# Patient Record
Sex: Female | Born: 1970 | Hispanic: Yes | Smoking: Never smoker
Health system: Southern US, Community
[De-identification: ages and names within clinical notes are randomized; demographics above are authoritative.]

## PROBLEM LIST (undated history)

## (undated) DIAGNOSIS — K2970 Gastritis, unspecified, without bleeding: Secondary | ICD-10-CM

## (undated) DIAGNOSIS — K219 Gastro-esophageal reflux disease without esophagitis: Secondary | ICD-10-CM

## (undated) DIAGNOSIS — K8020 Calculus of gallbladder without cholecystitis without obstruction: Secondary | ICD-10-CM

## (undated) DIAGNOSIS — D649 Anemia, unspecified: Secondary | ICD-10-CM

## (undated) DIAGNOSIS — J45909 Unspecified asthma, uncomplicated: Secondary | ICD-10-CM

## (undated) DIAGNOSIS — J302 Other seasonal allergic rhinitis: Secondary | ICD-10-CM

## (undated) HISTORY — DX: Anemia, unspecified: D64.9

## (undated) HISTORY — PX: WISDOM TOOTH EXTRACTION: SHX21

## (undated) HISTORY — DX: Gastro-esophageal reflux disease without esophagitis: K21.9

## (undated) HISTORY — DX: Other seasonal allergic rhinitis: J30.2

---

## 2013-05-07 ENCOUNTER — Emergency Department (HOSPITAL_COMMUNITY)
Admission: EM | Admit: 2013-05-07 | Discharge: 2013-05-08 | Disposition: A | Discharge status: Home / Self Care | Payer: Self-pay | Attending: Emergency Medicine | Admitting: Emergency Medicine

## 2013-05-07 ENCOUNTER — Emergency Department (HOSPITAL_COMMUNITY): Payer: Self-pay

## 2013-05-07 ENCOUNTER — Encounter (HOSPITAL_COMMUNITY): Payer: Self-pay | Admitting: Emergency Medicine

## 2013-05-07 DIAGNOSIS — Z79899 Other long term (current) drug therapy: Secondary | ICD-10-CM | POA: Insufficient documentation

## 2013-05-07 DIAGNOSIS — M79609 Pain in unspecified limb: Secondary | ICD-10-CM | POA: Insufficient documentation

## 2013-05-07 DIAGNOSIS — M7989 Other specified soft tissue disorders: Secondary | ICD-10-CM | POA: Insufficient documentation

## 2013-05-07 DIAGNOSIS — R110 Nausea: Secondary | ICD-10-CM | POA: Insufficient documentation

## 2013-05-07 DIAGNOSIS — R079 Chest pain, unspecified: Secondary | ICD-10-CM

## 2013-05-07 DIAGNOSIS — J45901 Unspecified asthma with (acute) exacerbation: Secondary | ICD-10-CM | POA: Insufficient documentation

## 2013-05-07 DIAGNOSIS — M549 Dorsalgia, unspecified: Secondary | ICD-10-CM | POA: Insufficient documentation

## 2013-05-07 DIAGNOSIS — R0789 Other chest pain: Secondary | ICD-10-CM | POA: Insufficient documentation

## 2013-05-07 HISTORY — DX: Unspecified asthma, uncomplicated: J45.909

## 2013-05-07 NOTE — ED Notes (Signed)
Pt c/o mid sternal chest pain and low back pain. Pt also states she has swelling in her L arm and hand. Symptoms x 5 days. Pt states she has had pressure to her chest and felt like fainting yesterday. Pt states she has also felt nauseous. Pt alert, no acute distress. Skin warm and dry.

## 2013-05-07 NOTE — ED Notes (Signed)
PA at bedside.

## 2013-05-07 NOTE — ED Notes (Signed)
EKG given to EDP,Knapp,Iva,MD. For review. 

## 2013-05-08 LAB — CBC WITH DIFFERENTIAL/PLATELET
Basophils Absolute: 0 10*3/uL (ref 0.0–0.1)
Basophils Relative: 0 % (ref 0–1)
EOS PCT: 1 % (ref 0–5)
Eosinophils Absolute: 0.2 10*3/uL (ref 0.0–0.7)
HCT: 37.8 % (ref 36.0–46.0)
HEMOGLOBIN: 12.6 g/dL (ref 12.0–15.0)
LYMPHS PCT: 31 % (ref 12–46)
Lymphs Abs: 3.5 10*3/uL (ref 0.7–4.0)
MCH: 28.8 pg (ref 26.0–34.0)
MCHC: 33.3 g/dL (ref 30.0–36.0)
MCV: 86.5 fL (ref 78.0–100.0)
MONOS PCT: 8 % (ref 3–12)
Monocytes Absolute: 0.8 10*3/uL (ref 0.1–1.0)
NEUTROS ABS: 6.6 10*3/uL (ref 1.7–7.7)
Neutrophils Relative %: 60 % (ref 43–77)
Platelets: 239 10*3/uL (ref 150–400)
RBC: 4.37 MIL/uL (ref 3.87–5.11)
RDW: 13.3 % (ref 11.5–15.5)
WBC: 11 10*3/uL — AB (ref 4.0–10.5)

## 2013-05-08 LAB — COMPREHENSIVE METABOLIC PANEL
ALT: 21 U/L (ref 0–35)
AST: 27 U/L (ref 0–37)
Albumin: 3.7 g/dL (ref 3.5–5.2)
Alkaline Phosphatase: 102 U/L (ref 39–117)
BUN: 12 mg/dL (ref 6–23)
CHLORIDE: 100 meq/L (ref 96–112)
CO2: 20 mEq/L (ref 19–32)
Calcium: 9.5 mg/dL (ref 8.4–10.5)
Creatinine, Ser: 0.58 mg/dL (ref 0.50–1.10)
GFR calc non Af Amer: >90 mL/min (ref >90)
Glucose, Bld: 92 mg/dL (ref 70–99)
Potassium: 4.1 mEq/L (ref 3.7–5.3)
Sodium: 138 mEq/L (ref 137–147)
Total Protein: 7.7 g/dL (ref 6.0–8.3)

## 2013-05-08 LAB — TROPONIN I: Troponin I: <0.3 ng/mL (ref <0.30)

## 2013-05-08 MED ORDER — OMEPRAZOLE 20 MG PO CPDR: 20.0000 mg | DELAYED_RELEASE_CAPSULE | Freq: Every day | ORAL | Status: DC

## 2013-05-08 NOTE — ED Provider Notes (Signed)
Medical screening examination/treatment/procedure(s) were performed by non-physician practitioner and as supervising physician I was immediately available for consultation/collaboration.   EKG Interpretation None       Derwood KaplanAnkit Raushanah Osmundson, MD 05/08/13 [phone]

## 2013-05-08 NOTE — ED Provider Notes (Signed)
CSN: [id number]     Arrival date & time 05/07/13  2041 History   First MD Initiated Contact with Patient 05/07/13 2213     Chief Complaint  Patient presents with  . Chest Pain  . Back Pain     (Consider location/radiation/quality/duration/timing/severity/associated sxs/prior Treatment) Patient is a 43 y.o. female presenting with chest pain. The history is provided by the patient and a relative. A language interpreter was used Veterinary surgeon used).  Chest Pain Pain location:  L chest Pain quality: aching   Pain radiates to the back: yes   Pain severity:  Moderate Onset quality:  Gradual Duration:  5 days Associated symptoms: nausea and shortness of breath   Associated symptoms: no abdominal pain, no cough, no fever and not vomiting   Associated symptoms comment:  She has had left sided chest pain for the past 5 days. She reports that it comes and goes, better with rest, worse with activity but also worse when she gets into an argument. She reports the pain goes into her left arm and leg and causes swelling. She has had nausea without vomiting.    Past Medical History  Diagnosis Date  . Asthma    History reviewed. No pertinent past surgical history. No family history on file. History  Substance Use Topics  . Smoking status: Not on file  . Smokeless tobacco: Not on file  . Alcohol Use: Not on file   OB History   Grav Para Term Preterm Abortions TAB SAB Ect Mult Living                 Review of Systems  Constitutional: Negative for fever and chills.  Respiratory: Positive for shortness of breath. Negative for cough.   Cardiovascular: Positive for chest pain.  Gastrointestinal: Positive for nausea. Negative for vomiting and abdominal pain.  Musculoskeletal: Negative.   Skin: Negative.   Neurological: Negative.       Allergies  Ibuprofen and Penicillins  Home Medications   Current Outpatient Rx  Name  Route  Sig  Dispense  Refill  . acetaminophen (TYLENOL)  500 MG tablet   Oral   Take 1,000 mg by mouth every 6 (six) hours as needed for moderate pain.         Marland Kitchen omeprazole (PRILOSEC) 20 MG capsule   Oral   Take 20 mg by mouth daily.         Marland Kitchen omeprazole (PRILOSEC) 20 MG capsule   Oral   Take 1 capsule (20 mg total) by mouth daily.   14 capsule   0    BP 123/79  Pulse 86  Temp(Src) 97.6 F (36.4 C) (Oral)  Resp 23  SpO2 100% Physical Exam  Constitutional: She is oriented to person, place, and time. She appears well-developed and well-nourished.  HENT:  Head: Normocephalic.  Neck: Normal range of motion. Neck supple.  Cardiovascular: Normal rate and regular rhythm.   Pulmonary/Chest: Effort normal and breath sounds normal.  Abdominal: Soft. Bowel sounds are normal. There is no tenderness. There is no rebound and no guarding.  Musculoskeletal: Normal range of motion.  Neurological: She is alert and oriented to person, place, and time.  Skin: Skin is warm and dry. No rash noted.  Psychiatric: She has a normal mood and affect.    ED Course  Procedures (including critical care time) Labs Review Labs Reviewed  CBC WITH DIFFERENTIAL - Abnormal; Notable for the following:    WBC 11.0 (*)    All other  components within normal limits  COMPREHENSIVE METABOLIC PANEL - Abnormal; Notable for the following:    Total Bilirubin <0.2 (*)    All other components within normal limits  TROPONIN I   Results for orders placed during the hospital encounter of 05/07/13  TROPONIN I      Result Value Ref Range   Troponin I <0.30  <0.30 ng/mL  CBC WITH DIFFERENTIAL      Result Value Ref Range   WBC 11.0 (*) 4.0 - 10.5 K/uL   RBC 4.37  3.87 - 5.11 MIL/uL   Hemoglobin 12.6  12.0 - 15.0 g/dL   HCT 19.137.8  47.836.0 - 29.546.0 %   MCV 86.5  78.0 - 100.0 fL   MCH 28.8  26.0 - 34.0 pg   MCHC 33.3  30.0 - 36.0 g/dL   RDW 62.113.3  30.811.5 - 65.715.5 %   Platelets 239  150 - 400 K/uL   Neutrophils Relative % 60  43 - 77 %   Neutro Abs 6.6  1.7 - 7.7 K/uL    Lymphocytes Relative 31  12 - 46 %   Lymphs Abs 3.5  0.7 - 4.0 K/uL   Monocytes Relative 8  3 - 12 %   Monocytes Absolute 0.8  0.1 - 1.0 K/uL   Eosinophils Relative 1  0 - 5 %   Eosinophils Absolute 0.2  0.0 - 0.7 K/uL   Basophils Relative 0  0 - 1 %   Basophils Absolute 0.0  0.0 - 0.1 K/uL  COMPREHENSIVE METABOLIC PANEL      Result Value Ref Range   Sodium 138  137 - 147 mEq/L   Potassium 4.1  3.7 - 5.3 mEq/L   Chloride 100  96 - 112 mEq/L   CO2 20  19 - 32 mEq/L   Glucose, Bld 92  70 - 99 mg/dL   BUN 12  6 - 23 mg/dL   Creatinine, Ser 8.460.58  0.50 - 1.10 mg/dL   Calcium 9.5  8.4 - 96.210.5 mg/dL   Total Protein 7.7  6.0 - 8.3 g/dL   Albumin 3.7  3.5 - 5.2 g/dL   AST 27  0 - 37 U/L   ALT 21  0 - 35 U/L   Alkaline Phosphatase 102  39 - 117 U/L   Total Bilirubin <0.2 (*) 0.3 - 1.2 mg/dL   GFR calc non Af Amer >90  >90 mL/min   GFR calc Af Amer >90  >90 mL/min   Dg Chest Portable 1 View  05/08/2013   CLINICAL DATA:  Chest pain  EXAM: PORTABLE CHEST - 1 VIEW  COMPARISON:  None.  FINDINGS: The cardiac and mediastinal silhouettes are within normal limits.  The lungs are normally inflated. No airspace consolidation, pleural effusion, or pulmonary edema is identified. There is no pneumothorax.  No acute osseous abnormality identified.  IMPRESSION: No acute cardiopulmonary abnormality.   Electronically Signed   By: Rise MuBenjamin  McClintock M.D.   On: 05/08/2013 01:04   Imaging Review Dg Chest Portable 1 View  05/08/2013   CLINICAL DATA:  Chest pain  EXAM: PORTABLE CHEST - 1 VIEW  COMPARISON:  None.  FINDINGS: The cardiac and mediastinal silhouettes are within normal limits.  The lungs are normally inflated. No airspace consolidation, pleural effusion, or pulmonary edema is identified. There is no pneumothorax.  No acute osseous abnormality identified.  IMPRESSION: No acute cardiopulmonary abnormality.   Electronically Signed   By: Rise MuBenjamin  McClintock M.D.   On: 05/08/2013 01:04  EKG  Interpretation None      MDM   Final diagnoses:  Chest pain    She is well appearing, normal VS with atypical chest pain for ACS. Normal labs, x-ray and EKG reassuring. Daughter gives additional history of frequent hiccups - ?reflux causing symptoms. Will give trial of Prilosec and encourage PCP follow up.    Arnoldo Hooker, PA-C 05/08/13 0147

## 2013-05-08 NOTE — Discharge Instructions (Signed)
Dolor en el pecho (inespecfico)  (Chest Pain, Nonspecific)  Frecuentemente es difcil dar un diagnstico especfico de la causa de un dolor en el pecho. Siempre existe una posibilidad de que el dolor est relacionado con algo ms grave como un ataque cardaco o un cogulo en los pulmones. Necesitar realizar un seguimiento con el mdico para una mayor evaluacin.   CAUSAS   Acidez.   Neumona o bronquitis.   Ansiedad o estrs.   Una inflamacin de la zona que rodea al corazn (pericarditis) o a los pulmones (pleuritis, pleuresa).   Un cogulo sanguneo en el pulmn.   Pulmones colapsados (neumotrax). Puede aparecer de manera repentina por s solo (neumotrax espontneo) o por un traumatismo en el pecho.   Culebrilla (virus del herpes zster).  Las paredes del pecho estn compuestas de huesos, msculos y cartlagos. Cualquiera de estos puede ser fuente del dolor.   Puede haber una contusin en los huesos debido a una lesin.   Puede haber un esguince en los msculos o el cartlago ocasionado por la tos o un esfuerzo.   El cartlago tambin puede verse afectado por una inflamacin y producir dolor (costocondritis).  DIAGNSTICO  Puede ser necesario realizar anlisis de laboratorio u otros estudios tales como radiografas, electrocardiograma, prueba de esfuerzo, o diagnstico por imgenes para el corazn para determinar la causa de su dolor.   TRATAMIENTO   El tratamiento depender de la causa que provoque el dolor en el pecho. El tratamiento pueden incluir:   Bloqueadores de cido para la acidez estomacal.   Medicamentos antiinflamatorios.   Analgsicos para las enfermedades inflamatorias.   Antibiticos si existe infeccin.   Podrn aconsejarle que modifique su estilo de vida. Esto incluye dejar de fumar y evitar el alcohol, la cafena y el chocolate.   Se le aconsejar que duerma con la cabeza levantada (elevada). Esto reduce la probabilidad de que el cido vuelva hacia atrs desde el estmago  hasta el esfago.   La mayora de las veces, el dolor en el pecho no especfico mejora dentro de los 2 a 3 das con reposo y medicamentos para el dolor suaves.  INSTRUCCIONES PARA EL CUIDADO DOMICILIARIO   Si le prescriben antibiticos, tmelos tal como se le indic. Tmelos todos, aunque se sienta mejor.   Durante los siguientes das, evite la actividad fsica que le hace aparecer el dolor. Contine con las actividades fsicas tal como se le indic.   No fume.   Evite consumir alcohol.   Slo tome medicamentos de venta libre o prescriptos para calmar el dolor, las molestias o bajar la fiebre segn las indicaciones de su mdico.   Siga las indicaciones del profesional que lo asiste para un mayor control si los problemas persisten.   Cumpla con todas las visitas de control programadas. Si no lo hace, podr desarrollar problemas permanentes (crnicos) relacionados con el dolor. Si tiene algn problema para asistir a la cita, debe comunicarse con el establecimiento para obtener asistencia.  SOLICITE ATENCIN MDICA SI:   Tiene problemas que considere que pueden ser efectos secundarios de los medicamentos que toma. Lea con cuidado las recomendaciones para su medicacin.   El dolor en el pecho contina incluso despus de haber seguido el tratamiento.   Observa una erupcin en el pecho, que presenta ampollas.  SOLICITE ATENCIN MDICA DE INMEDIATO SI:   El dolor en el pecho aumenta o se extiende al brazo, cuello, mandbula, espalda o abdomen.   Le falta el aire, aumenta la tos o tose y   escupe sangre.   Tiene dolor intenso en la espalda o el abdomen, nuseas o vmitos.   Presenta debilidad, desmayos o escalofros.   Tiene fiebre.  ESTO ES UNA EMERGENCIA. No espere a que el dolor se vaya. Pida ayuda mdica de inmediato. Comunquese con el servicio de urgencias de su localidad (911 en [place]). No maneje solo hasta el hospital.  EST SEGURO QUE:    Comprende las instrucciones para el alta  mdica.   Controlar su enfermedad.   Solicitar atencin mdica de inmediato segn las indicaciones.  Document Released: 02/12/2005 Document Revised: 05/07/2011  [employer] Patient Information 2014 [employer].

## 2014-09-14 ENCOUNTER — Emergency Department (HOSPITAL_COMMUNITY)
Admission: EM | Admit: 2014-09-14 | Discharge: 2014-09-14 | Disposition: A | Discharge status: Home / Self Care | Payer: Self-pay | Attending: Emergency Medicine | Admitting: Emergency Medicine

## 2014-09-14 ENCOUNTER — Encounter (HOSPITAL_COMMUNITY): Payer: Self-pay | Admitting: *Deleted

## 2014-09-14 DIAGNOSIS — Z79899 Other long term (current) drug therapy: Secondary | ICD-10-CM | POA: Insufficient documentation

## 2014-09-14 DIAGNOSIS — L980 Pyogenic granuloma: Secondary | ICD-10-CM | POA: Insufficient documentation

## 2014-09-14 DIAGNOSIS — J45909 Unspecified asthma, uncomplicated: Secondary | ICD-10-CM | POA: Insufficient documentation

## 2014-09-14 DIAGNOSIS — Z880 Allergy status to penicillin: Secondary | ICD-10-CM | POA: Insufficient documentation

## 2014-09-14 NOTE — ED Provider Notes (Signed)
CSN: [id number]     Arrival date & time 09/14/14  2103 History  This chart was scribed for non-physician practitioner, Antony MaduraKelly Aalyah Mansouri, PA-C working with Elwin MochaBlair Walden, MD by Placido SouLogan Joldersma, ED scribe. This patient was seen in room WTR9/WTR9 and the patient's care was started at 9:36 PM.   Chief Complaint  Patient presents with  . Finger Injury   The history is provided by the patient and a relative. No language interpreter was used.    HPI Comments: Christina Vance is a 44 y.o. female who presents to the Emergency Department complaining of a constant, mild, point of swelling to her right thumb with onset 1 month ago. 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 She notes intermittent but controlled bleeding from the wound and constant, mild, pain. She denies taking any medications for her symptoms. She denies fever or chills.    Past Medical History  Diagnosis Date  . Asthma    History reviewed. No pertinent past surgical history. No family history on file. History  Substance Use Topics  . Smoking status: Never Smoker   . Smokeless tobacco: Not on file  . Alcohol Use: No   OB History    No data available      Review of Systems  Skin: Positive for wound.  All other systems reviewed and are negative.   Allergies  Ibuprofen and Penicillins  Home Medications   Prior to Admission medications   Medication Sig Start Date End Date Taking? Authorizing Provider  acetaminophen (TYLENOL) 500 MG tablet Take 1,000 mg by mouth every 6 (six) hours as needed for moderate pain.    Historical Provider, MD  omeprazole (PRILOSEC) 20 MG capsule Take 20 mg by mouth daily.    Historical Provider, MD  omeprazole (PRILOSEC) 20 MG capsule Take 1 capsule (20 mg total) by mouth daily. 05/08/13   Shari Upstill, PA-C   BP 110/80 mmHg  Pulse 91  Temp(Src) 98.2 F (36.8 C) (Oral)  Resp 18  SpO2 97%  LMP 09/07/2014   Physical Exam   Constitutional: She is oriented to person, place, and time. She appears well-developed and well-nourished. No distress.  HENT:  Head: Normocephalic and atraumatic.  Eyes: Conjunctivae and EOM are normal. No scleral icterus.  Neck: Normal range of motion.  Cardiovascular: Normal rate, regular rhythm and intact distal pulses.   Distal radial pulse 2+ in the right upper extremity. Capillary refill brisk in all digits.  Pulmonary/Chest: Effort normal. No respiratory distress.  Musculoskeletal: Normal range of motion.  Neurological: She is alert and oriented to person, place, and time. She exhibits normal muscle tone. Coordination normal.  Sensation to light touch intact in all digits. Patient able to wiggle all fingers.  Skin: Skin is warm and dry. No rash noted. She is not diaphoretic. No erythema. No pallor.  Small pyogenic granuloma noted to the right thumb. Scan bleeding is well controlled with pressure.  Psychiatric: She has a normal mood and affect. Her behavior is normal.  Nursing note and vitals reviewed.   ED Course  Procedures  DIAGNOSTIC STUDIES: Oxygen Saturation is 97% on RA, normal by my interpretation.    COORDINATION OF CARE: 9:43 PM Discussed treatment plan with pt at bedside and pt agreed to plan.  Labs Review Labs Reviewed - No data to display  Imaging Review No results found.   EKG Interpretation None      MDM   Final diagnoses:  Pyogenic granuloma of  skin    44 year old female presents to the emergency department for evaluation of a bump on her right thumb. Physical exam is consistent with a pyogenic granuloma. Patient is neurovascularly intact. Will refer to dermatology for further evaluation of symptoms. Return precautions given. Patient discharged in good condition.  I personally performed the services described in this documentation, which was scribed in my presence. The recorded information has been reviewed and is accurate.   Filed Vitals:    09/14/14 2127  BP: 110/80  Pulse: 91  Temp: 98.2 F (36.8 C)  TempSrc: Oral  Resp: 18  SpO2: 97%     Antony Madura, PA-C 09/14/14 2208  Elwin Mocha, MD 09/14/14 [phone]

## 2014-09-14 NOTE — ED Notes (Signed)
Pt states she has had a bump on her finger that has been bleeding intermittently for the past month. 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

## 2014-09-14 NOTE — Discharge Instructions (Signed)
You have a pyogenic granuloma on your finger. A pyogenic granuloma is a relatively common skin growth. It is usually a small red, oozing and bleeding bump that looks like raw hamburger meat. It often seems to follows a minor injury and grows rapidly over a period of a few weeks to an average size of a half an inch. The head, neck, upper trunk and hands and feet are the most commonly sites. Follow up with a dermatologist to discuss having this removed from your finger. Keep the area covered to prevent further bleeding, which can occur when it is hit on something. Take tylenol or ibuprofen for pain.  Usted tiene un granuloma pigeno en el dedo . Un granuloma pigeno es un crecimiento relativamente comn de la piel . Por lo general, una pequea de color rojo , secrecin y sangrado protuberancia que se ve como carne de [place] . A menudo parece que despus de una lesin menor y crece rpidamente durante un perodo de unas pocas semanas a un tamao promedio de International aid/development workermedia pulgada . La cabeza, el cuello, la parte superior del tronco y las manos y los pies son los sitios ms comunes . Haga un seguimiento con un dermatlogo para discutir tener esta eliminado de su dedo. Mantenga el rea cubierta para evitar el sangrado , que puede ocurrir cuando se ha [place]ado en algo. Tomar Tylenol o ibuprofeno para el dolor .

## 2015-05-14 ENCOUNTER — Emergency Department (HOSPITAL_COMMUNITY)
Admission: EM | Admit: 2015-05-14 | Discharge: 2015-05-15 | Disposition: A | Discharge status: Home / Self Care | Payer: Self-pay | Attending: Emergency Medicine | Admitting: Emergency Medicine

## 2015-05-14 DIAGNOSIS — Y9289 Other specified places as the place of occurrence of the external cause: Secondary | ICD-10-CM | POA: Insufficient documentation

## 2015-05-14 DIAGNOSIS — Z79899 Other long term (current) drug therapy: Secondary | ICD-10-CM | POA: Insufficient documentation

## 2015-05-14 DIAGNOSIS — T7840XA Allergy, unspecified, initial encounter: Secondary | ICD-10-CM | POA: Insufficient documentation

## 2015-05-14 DIAGNOSIS — Z880 Allergy status to penicillin: Secondary | ICD-10-CM | POA: Insufficient documentation

## 2015-05-14 DIAGNOSIS — R220 Localized swelling, mass and lump, head: Secondary | ICD-10-CM | POA: Insufficient documentation

## 2015-05-14 DIAGNOSIS — J45909 Unspecified asthma, uncomplicated: Secondary | ICD-10-CM | POA: Insufficient documentation

## 2015-05-14 DIAGNOSIS — Y9389 Activity, other specified: Secondary | ICD-10-CM | POA: Insufficient documentation

## 2015-05-14 DIAGNOSIS — Y998 Other external cause status: Secondary | ICD-10-CM | POA: Insufficient documentation

## 2015-05-14 DIAGNOSIS — X58XXXA Exposure to other specified factors, initial encounter: Secondary | ICD-10-CM | POA: Insufficient documentation

## 2015-05-15 ENCOUNTER — Encounter (HOSPITAL_COMMUNITY): Payer: Self-pay | Admitting: Emergency Medicine

## 2015-05-15 MED ORDER — SODIUM CHLORIDE 0.9 % IV BOLUS (SEPSIS)
1000.0000 mL | Freq: Once | INTRAVENOUS | Status: AC
  Administered 2015-05-15: 1000 mL via INTRAVENOUS

## 2015-05-15 MED ORDER — DIPHENHYDRAMINE HCL 25 MG PO TABS: 25.0000 mg | ORAL_TABLET | Freq: Four times a day (QID) | ORAL | Status: DC | PRN

## 2015-05-15 MED ORDER — PREDNISONE 20 MG PO TABS: 40.0000 mg | ORAL_TABLET | Freq: Every day | ORAL | Status: DC

## 2015-05-15 MED ORDER — FAMOTIDINE IN NACL 20-0.9 MG/50ML-% IV SOLN
20.0000 mg | Freq: Once | INTRAVENOUS | Status: AC
  Administered 2015-05-15: 20 mg via INTRAVENOUS
  Filled 2015-05-15: qty 50

## 2015-05-15 MED ORDER — METHYLPREDNISOLONE SODIUM SUCC 125 MG IJ SOLR
125.0000 mg | Freq: Once | INTRAMUSCULAR | Status: AC
  Administered 2015-05-15: 125 mg via INTRAVENOUS
  Filled 2015-05-15: qty 2

## 2015-05-15 MED ORDER — DIPHENHYDRAMINE HCL 50 MG/ML IJ SOLN
25.0000 mg | Freq: Once | INTRAMUSCULAR | Status: AC
  Administered 2015-05-15: 25 mg via INTRAVENOUS
  Filled 2015-05-15: qty 1

## 2015-05-15 NOTE — ED Provider Notes (Signed)
CSN: [id number]     Arrival date & time 05/14/15  2339 History   First MD Initiated Contact with Patient 05/15/15 0015     Chief Complaint  Patient presents with  . Angioedema     (Consider location/radiation/quality/duration/timing/severity/associated sxs/prior Treatment) HPI Comments: Patient with past medical history of asthma presents emergency department with chief complaint allergic reaction. She states that she was eating pork tamales this evening around 6:30 PM. She states that sitting thereafter, she noticed some tingling and itching sensation in her lips. Her lips then began to swell. She also reports breaking out in rash on her arms. She states that her throat feels scratchy. She denies difficulty breathing. Denies ever having any problems like this before. She states that she has had some nausea, but denies any vomiting or diarrhea.  The history is provided by the patient. No language interpreter was used.    Past Medical History  Diagnosis Date  . Asthma    History reviewed. No pertinent past surgical history. No family history on file. Social History  Substance Use Topics  . Smoking status: Never Smoker   . Smokeless tobacco: None  . Alcohol Use: No   OB History    No data available     Review of Systems  Constitutional: Negative for fever and chills.  Respiratory: Negative for shortness of breath.   Cardiovascular: Negative for chest pain.  Gastrointestinal: Negative for nausea, vomiting, diarrhea and constipation.  Genitourinary: Negative for dysuria.  All other systems reviewed and are negative.     Allergies  Ibuprofen and Penicillins  Home Medications   Prior to Admission medications   Medication Sig Start Date End Date Taking? Authorizing Provider  acetaminophen (TYLENOL) 500 MG tablet Take 1,000 mg by mouth every 6 (six) hours as needed for moderate pain.    Historical Provider, MD  omeprazole (PRILOSEC) 20 MG capsule Take 20 mg by mouth daily.     Historical Provider, MD  omeprazole (PRILOSEC) 20 MG capsule Take 1 capsule (20 mg total) by mouth daily. 05/08/13   Shari Upstill, PA-C   BP 122/89 mmHg  Pulse 98  Temp(Src) 98.5 F (36.9 C) (Oral)  Resp 16  SpO2 100% Physical Exam  Constitutional: She is oriented to person, place, and time. She appears well-developed and well-nourished.  HENT:  Head: Normocephalic and atraumatic.  Lip swelling Oropharynx is clear No stridor Normal phonation  Eyes: Conjunctivae and EOM are normal. Pupils are equal, round, and reactive to light.  Neck: Normal range of motion. Neck supple.  Cardiovascular: Normal rate and regular rhythm.  Exam reveals no gallop and no friction rub.   No murmur heard. Pulmonary/Chest: Effort normal and breath sounds normal. No respiratory distress. She has no wheezes. She has no rales. She exhibits no tenderness.  CTAB No wheezing  Abdominal: Soft. Bowel sounds are normal. She exhibits no distension and no mass. There is no tenderness. There is no rebound and no guarding.  Musculoskeletal: Normal range of motion. She exhibits no edema or tenderness.  Neurological: She is alert and oriented to person, place, and time.  Skin: Skin is warm and dry.  Mild maculopapular rash on extremities  Psychiatric: She has a normal mood and affect. Her behavior is normal. Judgment and thought content normal.  Nursing note and vitals reviewed.   ED Course  Procedures (including critical care time)   MDM   Final diagnoses:  Allergic reaction, initial encounter    Patient with allergic reaction vs angioedema.  I lean more toward allergic reaction based on symptoms starting after eating pork tamales.  Patient doesn't take and lisinopril or any other BP meds.  No stridor.  Airway is intact.  No wheezing.  Will treat with benadryl, solumedrol, and pepcid.  Patient signed out to [facility], NP, who will reassess and dispo if improved.  Patient discussed with Dr. Judd LieneLo, who agrees  with the plan.    Roxy Horsemanobert Kenzi Bardwell, PA-C 05/15/15 [id number]  Geoffery Lyonsouglas Delo, MD 05/16/15 [phone]

## 2015-05-15 NOTE — ED Provider Notes (Signed)
Patient states that she feels the swelling has decreased.  She is having no difficulty swallowing.  She is on swelling, no difficulty breathing.  She has been given a prescription for prednisone and Benadryl provided by Roxy Horsemanobert Browning PA, who initially saw the patient. She has been given strict return parameters such as difficulty swallowing, difficulty breathing, abdominal pain, rapid heart rate  Christina FavorGail Stephan Nelis, NP 05/15/15 0216  Geoffery Lyonsouglas Delo, MD 05/16/15 [phone]

## 2015-05-15 NOTE — ED Notes (Signed)
Pt states she ate pork tamales around 1830 on 3/18. Pt states around 1900 her lips began to itch. She has itching in her throat, but has a clear airway. Pt is alert and oriented, but only speaks spanish but her daughter is here and can translate for her

## 2015-05-15 NOTE — Discharge Instructions (Signed)

## 2015-10-12 ENCOUNTER — Encounter (HOSPITAL_COMMUNITY): Payer: Self-pay | Admitting: Emergency Medicine

## 2015-10-12 ENCOUNTER — Emergency Department (HOSPITAL_COMMUNITY): Payer: Self-pay

## 2015-10-12 ENCOUNTER — Emergency Department (HOSPITAL_COMMUNITY)
Admission: EM | Admit: 2015-10-12 | Discharge: 2015-10-12 | Disposition: A | Discharge status: Home / Self Care | Payer: Self-pay | Attending: Emergency Medicine | Admitting: Emergency Medicine

## 2015-10-12 DIAGNOSIS — Z7952 Long term (current) use of systemic steroids: Secondary | ICD-10-CM | POA: Insufficient documentation

## 2015-10-12 DIAGNOSIS — N644 Mastodynia: Secondary | ICD-10-CM | POA: Insufficient documentation

## 2015-10-12 DIAGNOSIS — J45909 Unspecified asthma, uncomplicated: Secondary | ICD-10-CM | POA: Insufficient documentation

## 2015-10-12 DIAGNOSIS — R102 Pelvic and perineal pain: Secondary | ICD-10-CM | POA: Insufficient documentation

## 2015-10-12 DIAGNOSIS — Z79899 Other long term (current) drug therapy: Secondary | ICD-10-CM | POA: Insufficient documentation

## 2015-10-12 LAB — COMPREHENSIVE METABOLIC PANEL
ALT: 20 U/L (ref 14–54)
AST: 22 U/L (ref 15–41)
Albumin: 4.1 g/dL (ref 3.5–5.0)
Alkaline Phosphatase: 91 U/L (ref 38–126)
Anion gap: 8 (ref 5–15)
BILIRUBIN TOTAL: 0.4 mg/dL (ref 0.3–1.2)
BUN: 8 mg/dL (ref 6–20)
CHLORIDE: 107 mmol/L (ref 101–111)
CO2: 23 mmol/L (ref 22–32)
Calcium: 9 mg/dL (ref 8.9–10.3)
Creatinine, Ser: 0.55 mg/dL (ref 0.44–1.00)
GFR calc Af Amer: >60 mL/min (ref >60)
Glucose, Bld: 83 mg/dL (ref 65–99)
Potassium: 3.5 mmol/L (ref 3.5–5.1)
Sodium: 138 mmol/L (ref 135–145)
TOTAL PROTEIN: 7.4 g/dL (ref 6.5–8.1)

## 2015-10-12 LAB — CBC
HCT: 37.8 % (ref 36.0–46.0)
Hemoglobin: 12.3 g/dL (ref 12.0–15.0)
MCH: 27.8 pg (ref 26.0–34.0)
MCHC: 32.5 g/dL (ref 30.0–36.0)
MCV: 85.5 fL (ref 78.0–100.0)
PLATELETS: 299 10*3/uL (ref 150–400)
RBC: 4.42 MIL/uL (ref 3.87–5.11)
RDW: 14.4 % (ref 11.5–15.5)
WBC: 9.3 10*3/uL (ref 4.0–10.5)

## 2015-10-12 LAB — URINALYSIS, ROUTINE W REFLEX MICROSCOPIC
BILIRUBIN URINE: NEGATIVE
GLUCOSE, UA: NEGATIVE mg/dL
KETONES UR: NEGATIVE mg/dL
LEUKOCYTES UA: NEGATIVE
Nitrite: NEGATIVE
PROTEIN: NEGATIVE mg/dL
Specific Gravity, Urine: 1.01 (ref 1.005–1.030)
pH: 6 (ref 5.0–8.0)

## 2015-10-12 LAB — WET PREP, GENITAL
Sperm: NONE SEEN
TRICH WET PREP: NONE SEEN
Yeast Wet Prep HPF POC: NONE SEEN

## 2015-10-12 LAB — POC URINE PREG, ED: Preg Test, Ur: NEGATIVE

## 2015-10-12 LAB — URINE MICROSCOPIC-ADD ON

## 2015-10-12 LAB — LIPASE, BLOOD: Lipase: 31 U/L (ref 11–51)

## 2015-10-12 MED ORDER — TRAMADOL HCL 50 MG PO TABS: 50.0000 mg | ORAL_TABLET | Freq: Four times a day (QID) | ORAL | 0 refills | Status: DC | PRN

## 2015-10-12 NOTE — Discharge Instructions (Signed)
Take tramadol for pain. [facility] and [facility]

## 2015-10-12 NOTE — ED Notes (Addendum)
PA at bedside.

## 2015-10-12 NOTE — ED Triage Notes (Signed)
Pt does not speak english, translator phone used. Pt c/o abdominal pain and nipple inflammation and soreness x 1 month. Pt sts that is typical for her period but her period came and went weeks ago and she still has the symptoms. Pt sts she has a "new" discharge every now and then. Pt also c/o nausea, no vomiting. Pt sts her R ovary is bothering her and causing pain. A&Ox4 and ambulatory. Pt's son speaks english but pt does not want to be

## 2015-10-12 NOTE — ED Notes (Signed)
US at bedside

## 2015-10-12 NOTE — ED Provider Notes (Signed)
[facility]-EMERGENCY DEPT Provider Note   CSN: [id number] Arrival date & time: 10/12/15  1611     History   Chief Complaint Chief Complaint  Patient presents with  . Abdominal Pain  . Breast Problem    HPI Christina Vance is a 45 y.o. female.  HPI Christina Vance is a 45 y.o. female presents to emergency department complaining of lower abdominal pain, pain to her feet, breast pain for 2 months. Patient states that she has been to her primary care doctor who did some "blood work to check hormones" and was told everything was normal. Pt states that her doctor thought maybe it is early menaupause and gave her some medications that are not helping. Pt states her entire stomach is burning, but states pain is mainly suprapubic and radiates around to the back. She denies any nausea or vomiting. Denies any fever. No vaginal discharge. Reports regular menstrual cycle every month. Patient states that she took a pregnancy test at home and they were negative. She denies any urinary symptoms. She's not taking any for pain.  Patient is Spanish-speaking only, interpreter phone was used for this visit.  Past Medical History:  Diagnosis Date  . Asthma     There are no active problems to display for this patient.   History reviewed. No pertinent surgical history.  OB History    No data available       Home Medications    Prior to Admission medications   Medication Sig Start Date End Date Taking? Authorizing Provider  diphenhydrAMINE (BENADRYL) 25 MG tablet Take 1 tablet (25 mg total) by mouth every 6 (six) hours as needed for itching (Rash). 05/15/15   Roxy Horsemanobert Browning, PA-C  loratadine (CLARITIN) 10 MG tablet Take 10 mg by mouth daily.    Historical Provider, MD  naproxen sodium (ANAPROX) 550 MG tablet Take 550 mg by mouth 2 (two) times daily with a meal.    Historical Provider, MD  omeprazole (PRILOSEC) 20 MG capsule Take 1 capsule (20 mg total) by mouth daily. Patient not  taking: Reported on 05/15/2015 05/08/13   Elpidio AnisShari Upstill, PA-C  predniSONE (DELTASONE) 20 MG tablet Take 2 tablets (40 mg total) by mouth daily. 05/15/15   Roxy Horsemanobert Browning, PA-C    Family History No family history on file.  Social History Social History  Substance Use Topics  . Smoking status: Never Smoker  . Smokeless tobacco: Not on file  . Alcohol use No     Allergies   Ibuprofen and Penicillins   Review of Systems Review of Systems  Constitutional: Negative for chills and fever.  Respiratory: Negative for cough, chest tightness and shortness of breath.   Cardiovascular: Negative for chest pain, palpitations and leg swelling.  Gastrointestinal: Positive for abdominal pain. Negative for diarrhea, nausea and vomiting.  Genitourinary: Positive for pelvic pain. Negative for dysuria, flank pain, vaginal bleeding, vaginal discharge and vaginal pain.  Musculoskeletal: Positive for back pain. Negative for arthralgias, myalgias, neck pain and neck stiffness.  Skin: Negative for rash.  Neurological: Negative for dizziness, weakness and headaches.  All other systems reviewed and are negative.    Physical Exam Updated Vital Signs BP 124/86 (BP Location: Right Arm)   Pulse 84   Temp 98.1 F (36.7 C) (Oral)   Resp 16   SpO2 100%   Physical Exam  Constitutional: She appears well-developed and well-nourished. No distress.  HENT:  Head: Normocephalic.  Eyes: Conjunctivae are normal.  Neck: Neck supple.  Cardiovascular: Normal rate,  regular rhythm and normal heart sounds.   Pulmonary/Chest: Effort normal and breath sounds normal. No respiratory distress. She has no wheezes. She has no rales.  Normal breasts bilaterally with some tenderness over the nipple. No erythema, swelling, masses palpated, nipple discharge.   Abdominal: Soft. Bowel sounds are normal. She exhibits no distension. There is tenderness. There is no rebound.  Suprapubic tenderness. No CVA tenderness  Genitourinary:   Genitourinary Comments: Normal external genitalia. Normal vaginal canal. Small thin white discharge. Cervix is normal, closed. No CMT. No uterine or adnexal tenderness. No masses palpated.    Musculoskeletal: She exhibits no edema.  Neurological: She is alert.  Skin: Skin is warm and dry.  Psychiatric: She has a normal mood and affect. Her behavior is normal.  Nursing note and vitals reviewed.    ED Treatments / Results  Labs (all labs ordered are listed, but only abnormal results are displayed) Labs Reviewed  WET PREP, GENITAL - Abnormal; Notable for the following:       Result Value   Clue Cells Wet Prep HPF POC PRESENT (*)    WBC, Wet Prep HPF POC MODERATE (*)    All other components within normal limits  URINALYSIS, ROUTINE W REFLEX MICROSCOPIC (NOT AT [facility]) - Abnormal; Notable for the following:    APPearance CLOUDY (*)    Hgb urine dipstick SMALL (*)    All other components within normal limits  URINE MICROSCOPIC-ADD ON - Abnormal; Notable for the following:    Squamous Epithelial / LPF 6-30 (*)    Bacteria, UA FEW (*)    All other components within normal limits  LIPASE, BLOOD  COMPREHENSIVE METABOLIC PANEL  CBC  POC URINE PREG, ED  GC/CHLAMYDIA PROBE AMP ([facility]) NOT AT [facility]    EKG  EKG Interpretation None       Radiology [place] Transvaginal Non-ob  Result Date: 10/12/2015 CLINICAL DATA:  45 year old female with pain and nausea for 1 month. Negative pregnancy test today. Initial encounter. EXAM: TRANSABDOMINAL AND TRANSVAGINAL ULTRASOUND OF PELVIS TECHNIQUE: Both transabdominal and transvaginal ultrasound examinations of the pelvis were performed. Transabdominal technique was performed for global imaging of the pelvis including uterus, ovaries, adnexal regions, and pelvic cul-de-sac. It was necessary to proceed with endovaginal exam following the transabdominal exam to visualize the overuse. COMPARISON:  None FINDINGS: Uterus Measurements: 10.5 x 5.6 x 6.1 cm.  No fibroids or other mass visualized. Incidental nabothian cysts. Endometrium Thickness: 12 mm.  No focal abnormality visualized. Right ovary Measurements: 3.3 x 2.0 x 2.2 cm. 11 mm dominant cyst or follicle incidentally noted. Normal appearance/no adnexal mass. Left ovary Measurements: Could not be identified despite trans abdominal and transvaginal imaging attempts. Other findings No abnormal free fluid. IMPRESSION: 1. Normal right ovary and uterus. 2. The left ovary could not be identified despite trans abdominal and transvaginal imaging. Electronically Signed   By: Odessa FlemingH  Hall M.D.   On: 10/12/2015 19:47   [place] Pelvis Complete  Result Date: 10/12/2015 CLINICAL DATA:  45 year old female with pain and nausea for 1 month. Negative pregnancy test today. Initial encounter. EXAM: TRANSABDOMINAL AND TRANSVAGINAL ULTRASOUND OF PELVIS TECHNIQUE: Both transabdominal and transvaginal ultrasound examinations of the pelvis were performed. Transabdominal technique was performed for global imaging of the pelvis including uterus, ovaries, adnexal regions, and pelvic cul-de-sac. It was necessary to proceed with endovaginal exam following the transabdominal exam to visualize the overuse. COMPARISON:  None FINDINGS: Uterus Measurements: 10.5 x 5.6 x 6.1 cm. No fibroids or other mass visualized.  Incidental nabothian cysts. Endometrium Thickness: 12 mm.  No focal abnormality visualized. Right ovary Measurements: 3.3 x 2.0 x 2.2 cm. 11 mm dominant cyst or follicle incidentally noted. Normal appearance/no adnexal mass. Left ovary Measurements: Could not be identified despite trans abdominal and transvaginal imaging attempts. Other findings No abnormal free fluid. IMPRESSION: 1. Normal right ovary and uterus. 2. The left ovary could not be identified despite trans abdominal and transvaginal imaging. Electronically Signed   By: Odessa Fleming M.D.   On: 10/12/2015 19:47    Procedures Procedures (including critical care time)  Medications  Ordered in ED Medications - No data to display   Initial Impression / Assessment and Plan / ED Course  I have reviewed the triage vital signs and the nursing notes.  Pertinent labs & imaging results that were available during my care of the patient were reviewed by me and considered in my medical decision making (see chart for details).  Clinical Course   Pt in ED with breast tenderness bilaterally, suprapubic pain. Will check labs, do pelvic exam, [place].   9:28 PM Pelvic exam unremarkable. Labs normal. Ultrasound negative, left ovary not visualized, however no tenderness over that area. Patient is not pregnant. Breast exam is unremarkable, no concern for infection or masses at this time. No changes in bowels. No vomiting. No fever. Will give tramadol for pain. Patient is allergic to NSAIDs. I will have patient follow-up with OB/GYN for further evaluation.  Final Clinical Impressions(s) / ED Diagnoses   Final diagnoses:  Pelvic pain in female  Breast tenderness in female    New Prescriptions New Prescriptions   TRAMADOL (ULTRAM) 50 MG TABLET    Take 1 tablet (50 mg total) by mouth every 6 (six) hours as needed.     Jaynie Crumble, PA-C 10/12/15 2130    Doug Sou, MD 10/13/15 0140

## 2015-10-13 LAB — GC/CHLAMYDIA PROBE AMP (~~LOC~~) NOT AT ARMC
Chlamydia: NEGATIVE
Neisseria Gonorrhea: NEGATIVE

## 2017-08-27 ENCOUNTER — Emergency Department (HOSPITAL_COMMUNITY)
Admission: EM | Admit: 2017-08-27 | Discharge: 2017-08-27 | Disposition: A | Discharge status: Home / Self Care | Payer: Self-pay | Attending: Emergency Medicine | Admitting: Emergency Medicine

## 2017-08-27 ENCOUNTER — Encounter (HOSPITAL_COMMUNITY): Payer: Self-pay

## 2017-08-27 DIAGNOSIS — K649 Unspecified hemorrhoids: Secondary | ICD-10-CM | POA: Insufficient documentation

## 2017-08-27 DIAGNOSIS — J45909 Unspecified asthma, uncomplicated: Secondary | ICD-10-CM | POA: Insufficient documentation

## 2017-08-27 LAB — POC OCCULT BLOOD, ED: Fecal Occult Bld: POSITIVE — AB

## 2017-08-27 LAB — CBC WITH DIFFERENTIAL/PLATELET
Basophils Absolute: 0 10*3/uL (ref 0.0–0.1)
Basophils Relative: 0 %
EOS PCT: 2 %
Eosinophils Absolute: 0.1 10*3/uL (ref 0.0–0.7)
HEMATOCRIT: 36.4 % (ref 36.0–46.0)
Hemoglobin: 11.6 g/dL — ABNORMAL LOW (ref 12.0–15.0)
LYMPHS ABS: 2.9 10*3/uL (ref 0.7–4.0)
LYMPHS PCT: 41 %
MCH: 26.4 pg (ref 26.0–34.0)
MCHC: 31.9 g/dL (ref 30.0–36.0)
MCV: 82.9 fL (ref 78.0–100.0)
MONO ABS: 0.5 10*3/uL (ref 0.1–1.0)
Monocytes Relative: 7 %
NEUTROS ABS: 3.6 10*3/uL (ref 1.7–7.7)
Neutrophils Relative %: 50 %
PLATELETS: 294 10*3/uL (ref 150–400)
RBC: 4.39 MIL/uL (ref 3.87–5.11)
RDW: 15.5 % (ref 11.5–15.5)
WBC: 7.2 10*3/uL (ref 4.0–10.5)

## 2017-08-27 LAB — COMPREHENSIVE METABOLIC PANEL
ALT: 22 U/L (ref 0–44)
AST: 25 U/L (ref 15–41)
Albumin: 3.8 g/dL (ref 3.5–5.0)
Alkaline Phosphatase: 106 U/L (ref 38–126)
Anion gap: 7 (ref 5–15)
BILIRUBIN TOTAL: 0.2 mg/dL — AB (ref 0.3–1.2)
BUN: 12 mg/dL (ref 6–20)
CHLORIDE: 108 mmol/L (ref 98–111)
CO2: 25 mmol/L (ref 22–32)
Calcium: 8.8 mg/dL — ABNORMAL LOW (ref 8.9–10.3)
Creatinine, Ser: 0.57 mg/dL (ref 0.44–1.00)
Glucose, Bld: 94 mg/dL (ref 70–99)
POTASSIUM: 3.8 mmol/L (ref 3.5–5.1)
Sodium: 140 mmol/L (ref 135–145)
TOTAL PROTEIN: 7.5 g/dL (ref 6.5–8.1)

## 2017-08-27 LAB — HCG, QUANTITATIVE, PREGNANCY

## 2017-08-27 NOTE — ED Provider Notes (Signed)
[facility]-EMERGENCY DEPT Provider Note   CSN: [id number] Arrival date & time: 08/27/17  1641     History   Chief Complaint No chief complaint on file.  Language interpretor was used throughout this evaluation.   HPI Christina Vance is a 47 y.o. female.  HPI   Patient is a 47 year old female with a history of gastritis who presents the emergency department today to be evaluated for rectal bleeding which began yesterday.  She states that she felt like she needed to have a bowel movement, she strained for several minutes and was unable to have a bowel movement.  She then noticed some bright red blood on the toilet paper.   States she had a normal bowel movement this morning and noted bright red blood on the toilet paper 2 more times today.  Blood has only been on the tissue and has not been in the commode. She states that it was a mild amount and was there was "a smear" on the tissue.  Denies any blood in her stools.  Denies that there was a blood in the commode.  She notes that she had a small area of swelling to her rectum today which was mildly painful.  No diarrhea or constipation.  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  Denies any nausea or vomiting.  No urinary symptoms.  No vaginal bleeding or hematuria.  No fevers.  No chest pain shortness of breath or back pain.  Past Medical History:  Diagnosis Date  . Asthma     There are no active problems to display for this patient.   History reviewed. No pertinent surgical history.   OB History   None      Home Medications    Prior to Admission medications   Medication Sig Start Date End Date Taking? Authorizing Provider  acetaminophen (TYLENOL) 500 MG tablet Take 500 mg by mouth daily as needed for headache.   Yes [provider]  ibuprofen (ADVIL,MOTRIN) 200 MG tablet Take 200 mg by mouth daily as needed (pain).   Yes [provider]  ranitidine (ZANTAC) 150 MG tablet Take 300 mg by mouth daily as needed for heartburn.   Yes [provider]  diphenhydrAMINE (BENADRYL) 25 MG tablet Take 1 tablet (25 mg total) by mouth every 6 (six) hours as needed for itching (Rash). Patient not taking: Reported on 08/27/2017 05/15/15   Roxy Horseman, PA-C  omeprazole (PRILOSEC) 20 MG capsule Take 1 capsule (20 mg total) by mouth daily. Patient not taking: Reported on 08/27/2017 05/08/13   Elpidio Anis, PA-C  predniSONE (DELTASONE) 20 MG tablet Take 2 tablets (40 mg total) by mouth daily. Patient not taking: Reported on 08/27/2017 05/15/15   Roxy Horseman, PA-C  traMADol (ULTRAM) 50 MG tablet Take 1 tablet (50 mg total) by mouth every 6 (six) hours as needed. Patient not taking: Reported on 08/27/2017 10/12/15   Jaynie Crumble, PA-C    Family History History reviewed. No pertinent family history.  Social History Social History   Tobacco Use  . Smoking status: Never Smoker  . Smokeless tobacco: Never Used  Substance Use Topics  . Alcohol use: No  . Drug use: Never     Allergies   Ibuprofen and Penicillins   Review of Systems Review of Systems  Constitutional: Negative for chills and fever.  HENT: Negative for ear pain and sore throat.   Eyes: Negative for pain and  visual disturbance.  Respiratory: Negative for shortness of breath.   Cardiovascular: Negative for chest pain.  Gastrointestinal: Positive for rectal pain. Negative for abdominal pain, blood in stool, constipation, diarrhea, nausea and vomiting.       Rectal bleeding  Genitourinary: Negative for decreased urine volume, dysuria, frequency, hematuria, pelvic pain, urgency, vaginal bleeding and vaginal discharge.  Musculoskeletal: Negative for arthralgias and back pain.  Skin: Negative for color change and rash.  Neurological: Negative for light-headedness and headaches.  All other systems reviewed and are negative.  Physical  Exam Updated Vital Signs BP 113/86   Pulse 68   Temp 98.1 F (36.7 C) (Oral)   Resp 18   Ht 5\' 2"  (1.575 m)   Wt 78.9 kg (174 lb)   LMP 08/22/2017   SpO2 100%   BMI 31.83 kg/m   Physical Exam  Constitutional: She appears well-developed and well-nourished. No distress.  HENT:  Head: Normocephalic and atraumatic.  Mouth/Throat: Oropharynx is clear and moist.  Eyes: Pupils are equal, round, and reactive to light. Conjunctivae are normal.  Neck: Neck supple.  Cardiovascular: Normal rate, regular rhythm, normal heart sounds and intact distal pulses.  No murmur heard. Pulmonary/Chest: Effort normal and breath sounds normal. No stridor. No respiratory distress. She has no wheezes. She has no rales.  Abdominal: Soft. Bowel sounds are normal. There is no tenderness. There is no guarding.  Genitourinary:  Genitourinary Comments: Rectal exam was completed with chaperone present. There was no gross blood on rectal exam, stool was light brown and soft. No obvious external hemorrhoids. Mild amount of pain during exam, and no obvious internal hemorrhoids palpated.  Musculoskeletal: She exhibits no edema.  Neurological: She is alert.  Skin: Skin is warm and dry.  Psychiatric: She has a normal mood and affect.  Nursing note and vitals reviewed.  ED Treatments / Results  Labs (all labs ordered are listed, but only abnormal results are displayed) Labs Reviewed  CBC WITH DIFFERENTIAL/PLATELET - Abnormal; Notable for the following components:      Result Value   Hemoglobin 11.6 (*)    All other components within normal limits  COMPREHENSIVE METABOLIC PANEL - Abnormal; Notable for the following components:   Calcium 8.8 (*)    Total Bilirubin 0.2 (*)    All other components within normal limits  POC OCCULT BLOOD, ED - Abnormal; Notable for the following components:   Fecal Occult Bld POSITIVE (*)    All other components within normal limits  HCG, QUANTITATIVE, PREGNANCY     EKG None  Radiology No results found.  Procedures Procedures (including critical care time)  Medications Ordered in ED Medications - No data to display   Initial Impression / Assessment and Plan / ED Course  I have reviewed the triage vital signs and the nursing notes.  Pertinent labs & imaging results that were available during my care of the patient were reviewed by me and considered in my medical decision making (see chart for details).     Final Clinical Impressions(s) / ED Diagnoses   Final diagnoses:  Hemorrhoids, unspecified hemorrhoid type   Patient with small amount of rectal bleeding that is occurred over the last 2 days.  Vital signs stable in the ED and patient in no acute distress.  Afebrile.  Rectal exam showed no gross blood on exam however her fecal occult blood testing was positive in the ED. Based on her description of sxs her bleeding seems most consistent with hemorrhoids. She denied any blood  in her stool or in the commode and reported only "smears" of blood on the toilet tissue. She also noted that she had the sxs after she strained to have a BM. She has no h/o GI bleed. She has had no episodes of bleeding in the ED and her cbc showed a hgb of 11.6. The remainder of her lab work was benign.  I discussed the results of her lab work and that her symptoms seem most consistent with hemorrhoids.  Gave her instructions to complete warm sitz bath's as well as to take MiraLAX to prevent constipation.  I gave her a referral to gastroenterology doctor as well as information to follow-up with her PCP.  I gave her strict return precautions for any continued or worsening symptoms.  She voiced an understanding of plan and reasons to return immediately to the ED.  All questions were answered.  ED Discharge Orders    None       Rayne Du 08/27/17 2339    Benjiman Core, MD 08/28/17 [phone]

## 2017-08-27 NOTE — ED Triage Notes (Signed)
Pt reports that that she has been having blood in her stools x 2 days reports that she noticed small clots. PT reports that she has been dx with Gastritis 5 years ago and was being followed up with GI doctor and presented with the same symptoms then, Pt also reports some acid reflux that has worsened in the past few months and make eating difficult

## 2017-08-27 NOTE — Discharge Instructions (Addendum)
Please follow-up with the gastroenterologist as discussed while you are in the emergency department.  Please follow-up with your primary care doctor in 1 week for reevaluation as well.  Please return to the emergency department if you have any persistent rectal bleeding, blood in your stool, blood in the commode, lightheadedness, dizziness or any other new or worsening symptoms in the meantime.

## 2017-08-27 NOTE — ED Notes (Signed)
Pt reports bright red blood when she wipes. Denies N/V. Hx of Gastritis. No external hemorrhoids visualized upon rectal exam.

## 2018-11-05 ENCOUNTER — Other Ambulatory Visit (HOSPITAL_COMMUNITY): Payer: Self-pay | Admitting: Internal Medicine

## 2018-11-05 ENCOUNTER — Other Ambulatory Visit: Payer: Self-pay | Admitting: Internal Medicine

## 2018-11-05 DIAGNOSIS — R1032 Left lower quadrant pain: Secondary | ICD-10-CM

## 2018-11-12 ENCOUNTER — Ambulatory Visit (HOSPITAL_COMMUNITY): Payer: Self-pay | Attending: Internal Medicine

## 2018-11-12 ENCOUNTER — Encounter (HOSPITAL_COMMUNITY): Payer: Self-pay

## 2019-04-20 ENCOUNTER — Emergency Department (HOSPITAL_COMMUNITY): Payer: Self-pay

## 2019-04-20 ENCOUNTER — Emergency Department (HOSPITAL_COMMUNITY)
Admission: EM | Admit: 2019-04-20 | Discharge: 2019-04-21 | Disposition: A | Discharge status: Home / Self Care | Payer: Self-pay | Attending: Emergency Medicine | Admitting: Emergency Medicine

## 2019-04-20 ENCOUNTER — Encounter (HOSPITAL_COMMUNITY): Payer: Self-pay | Admitting: Emergency Medicine

## 2019-04-20 ENCOUNTER — Other Ambulatory Visit: Payer: Self-pay

## 2019-04-20 DIAGNOSIS — J45909 Unspecified asthma, uncomplicated: Secondary | ICD-10-CM | POA: Insufficient documentation

## 2019-04-20 DIAGNOSIS — R519 Headache, unspecified: Secondary | ICD-10-CM | POA: Insufficient documentation

## 2019-04-20 DIAGNOSIS — K219 Gastro-esophageal reflux disease without esophagitis: Secondary | ICD-10-CM | POA: Insufficient documentation

## 2019-04-20 HISTORY — DX: Gastritis, unspecified, without bleeding: K29.70

## 2019-04-20 LAB — BASIC METABOLIC PANEL
Anion gap: 10 (ref 5–15)
BUN: 13 mg/dL (ref 6–20)
CO2: 22 mmol/L (ref 22–32)
Calcium: 8.7 mg/dL — ABNORMAL LOW (ref 8.9–10.3)
Chloride: 108 mmol/L (ref 98–111)
Creatinine, Ser: 0.56 mg/dL (ref 0.44–1.00)
GFR calc Af Amer: >60 mL/min (ref >60)
GFR calc non Af Amer: >60 mL/min (ref >60)
Glucose, Bld: 91 mg/dL (ref 70–99)
Potassium: 3.3 mmol/L — ABNORMAL LOW (ref 3.5–5.1)
Sodium: 140 mmol/L (ref 135–145)

## 2019-04-20 LAB — CBC WITH DIFFERENTIAL/PLATELET
Abs Immature Granulocytes: 0.04 10*3/uL (ref 0.00–0.07)
Basophils Absolute: 0 10*3/uL (ref 0.0–0.1)
Basophils Relative: 1 %
Eosinophils Absolute: 0.3 10*3/uL (ref 0.0–0.5)
Eosinophils Relative: 4 %
HCT: 35.7 % — ABNORMAL LOW (ref 36.0–46.0)
Hemoglobin: 10.9 g/dL — ABNORMAL LOW (ref 12.0–15.0)
Immature Granulocytes: 1 %
Lymphocytes Relative: 39 %
Lymphs Abs: 3 10*3/uL (ref 0.7–4.0)
MCH: 24.4 pg — ABNORMAL LOW (ref 26.0–34.0)
MCHC: 30.5 g/dL (ref 30.0–36.0)
MCV: 80 fL (ref 80.0–100.0)
Monocytes Absolute: 0.7 10*3/uL (ref 0.1–1.0)
Monocytes Relative: 9 %
Neutro Abs: 3.7 10*3/uL (ref 1.7–7.7)
Neutrophils Relative %: 46 %
Platelets: 281 10*3/uL (ref 150–400)
RBC: 4.46 MIL/uL (ref 3.87–5.11)
RDW: 17 % — ABNORMAL HIGH (ref 11.5–15.5)
WBC: 7.7 10*3/uL (ref 4.0–10.5)
nRBC: 0 % (ref 0.0–0.2)

## 2019-04-20 LAB — I-STAT BETA HCG BLOOD, ED (MC, WL, AP ONLY): I-stat hCG, quantitative: <5 m[IU]/mL (ref <5)

## 2019-04-20 MED ORDER — ONDANSETRON HCL 4 MG/2ML IJ SOLN
4.0000 mg | Freq: Once | INTRAMUSCULAR | Status: AC
  Administered 2019-04-20: 21:00:00 4 mg via INTRAVENOUS
  Filled 2019-04-20: qty 2

## 2019-04-20 MED ORDER — SODIUM CHLORIDE 0.9 % IV BOLUS
1000.0000 mL | Freq: Once | INTRAVENOUS | Status: AC
  Administered 2019-04-20: 21:00:00 1000 mL via INTRAVENOUS

## 2019-04-20 MED ORDER — KETOROLAC TROMETHAMINE 15 MG/ML IJ SOLN
15.0000 mg | Freq: Once | INTRAMUSCULAR | Status: AC
  Administered 2019-04-20: 21:00:00 15 mg via INTRAVENOUS
  Filled 2019-04-20: qty 1

## 2019-04-20 NOTE — ED Triage Notes (Signed)
Spanish interpreter Oswaldo Done # [phone] Pt c/o headache and dizzy x 15 days, reports in mornings when eats something feels like going to come back up, denies vomiting. Pain not relieved when taking tylenol, last use was today around 12-1 pm.

## 2019-04-20 NOTE — Discharge Instructions (Signed)
Reinicie su omeprazole para los sntomas del reflujo cido [address] Tylenol segn sea necesario para los dolores de [place]. Haga un seguimiento con su mdico de cabecera

## 2019-04-20 NOTE — ED Notes (Signed)
Pt transported to CT ?

## 2019-04-20 NOTE — ED Provider Notes (Signed)
[facility]-EMERGENCY DEPT Provider Note   CSN: [id number] Arrival date & time: 04/20/19  1635     History Chief Complaint  Patient presents with  . Headache    Christina Vance is a 49 y.o. female who presents with a headache.  Patient is Spanish-speaking and language interpreter was used.  She states that for the past 15 days when she wakes up she will feel like she will have a lot of acid reflux and will be nauseous.  And she will start to get a headache and feel dizzy.  Sometimes her bilateral hands and feet will will tingle.  Symptoms seem to get better throughout the day but if she eats again will get worse.  She was taking omeprazole for GERD however stopped this a couple weeks ago because she is afraid to take medicines long-term due to side effects.  She has been taking Tylenol for her symptoms and Pepto-Bismol without significant relief.  She was taking p.o. Toradol which seemed to help her symptoms but she was told to stop this medicine.  She denies fever, chills, URI symptoms, chest pain, shortness of breath, cough, abdominal pain, vomiting, diarrhea or urinary symptoms.  She states that symptoms seem to happen after experience acid reflux but even when she was on the omeprazole and her reflux was controlled she would have headaches.   HPI     Past Medical History:  Diagnosis Date  . Asthma   . Gastritis     There are no problems to display for this patient.   History reviewed. No pertinent surgical history.   OB History   No obstetric history on file.     No family history on file.  Social History   Tobacco Use  . Smoking status: Never Smoker  . Smokeless tobacco: Never Used  Substance Use Topics  . Alcohol use: No  . Drug use: Never    Home Medications Prior to Admission medications   Medication Sig Start Date End Date Taking? Authorizing Provider  acetaminophen (TYLENOL) 500 MG tablet Take 500 mg by mouth daily as needed  for headache.   Yes [provider]  diphenhydrAMINE (BENADRYL) 25 MG tablet Take 1 tablet (25 mg total) by mouth every 6 (six) hours as needed for itching (Rash). Patient not taking: Reported on 08/27/2017 05/15/15   Roxy Horseman, PA-C  omeprazole (PRILOSEC) 20 MG capsule Take 1 capsule (20 mg total) by mouth daily. Patient not taking: Reported on 08/27/2017 05/08/13   Elpidio Anis, PA-C  predniSONE (DELTASONE) 20 MG tablet Take 2 tablets (40 mg total) by mouth daily. Patient not taking: Reported on 08/27/2017 05/15/15   Roxy Horseman, PA-C  traMADol (ULTRAM) 50 MG tablet Take 1 tablet (50 mg total) by mouth every 6 (six) hours as needed. Patient not taking: Reported on 08/27/2017 10/12/15   Jaynie Crumble, PA-C    Allergies    Ibuprofen and Penicillins  Review of Systems   Review of Systems  Constitutional: Negative for fever.  Respiratory: Negative for shortness of breath.   Cardiovascular: Negative for chest pain.  Gastrointestinal: Positive for nausea. Negative for abdominal pain, diarrhea and vomiting.  Neurological: Positive for dizziness and headaches. Negative for weakness and numbness.  All other systems reviewed and are negative.   Physical Exam Updated Vital Signs BP 118/70 (BP Location: Right Arm)   Pulse 89   Temp 99.3 F (37.4 C) (Oral)   Resp 17   LMP 03/13/2019   SpO2 98%  Physical Exam Vitals and nursing note reviewed.  Constitutional:      General: She is not in acute distress.    Appearance: She is well-developed. She is not ill-appearing.     Comments: Calm, cooperative. NAD. Spanish speaking  HENT:     Head: Normocephalic and atraumatic.  Eyes:     General: No scleral icterus.       Right eye: No discharge.        Left eye: No discharge.     Conjunctiva/sclera: Conjunctivae normal.     Pupils: Pupils are equal, round, and reactive to light.  Cardiovascular:     Rate and Rhythm: Normal rate and regular rhythm.  Pulmonary:     Effort:  Pulmonary effort is normal. No respiratory distress.     Breath sounds: Normal breath sounds.  Abdominal:     General: There is no distension.     Palpations: Abdomen is soft.     Tenderness: There is no abdominal tenderness.  Musculoskeletal:     Cervical back: Normal range of motion.  Skin:    General: Skin is warm and dry.  Neurological:     Mental Status: She is alert and oriented to person, place, and time.     Comments: Lying on stretcher in NAD. GCS 15. Speaks in a clear voice. Cranial nerves II through XII grossly intact. 5/5 strength in all extremities. Sensation fully intact.  Bilateral finger-to-nose intact. Ambulatory    Psychiatric:        Behavior: Behavior normal.     ED Results / Procedures / Treatments   Labs (all labs ordered are listed, but only abnormal results are displayed) Labs Reviewed  BASIC METABOLIC PANEL - Abnormal; Notable for the following components:      Result Value   Potassium 3.3 (*)    Calcium 8.7 (*)    All other components within normal limits  CBC WITH DIFFERENTIAL/PLATELET - Abnormal; Notable for the following components:   Hemoglobin 10.9 (*)    HCT 35.7 (*)    MCH 24.4 (*)    RDW 17.0 (*)    All other components within normal limits  I-STAT BETA HCG BLOOD, ED ([facility], [facility], [facility] ONLY)    EKG None  Radiology CT Head Wo Contrast  Result Date: 04/20/2019 CLINICAL DATA:  Headache and dizziness for 15 days EXAM: CT HEAD WITHOUT CONTRAST TECHNIQUE: Contiguous axial images were obtained from the base of the skull through the vertex without intravenous contrast. COMPARISON:  None. FINDINGS: Brain: Unenhanced images of the brain demonstrate no signs of acute infarct or hemorrhage. Lateral ventricles and midline structures are unremarkable. No acute extra-axial fluid collections. No mass effect. Vascular: No hyperdense vessel or unexpected calcification. Skull: Normal. Negative for fracture or focal lesion. Sinuses/Orbits: No acute finding. Other:  None IMPRESSION: 1. No acute intracranial process. Electronically Signed   By: Randa Ngo M.D.   On: 04/20/2019 21:51    Procedures Procedures (including critical care time)  Medications Ordered in ED Medications  sodium chloride 0.9 % bolus 1,000 mL (1,000 mLs Intravenous New Bag/Given 04/20/19 2117)  ketorolac (TORADOL) 15 MG/ML injection 15 mg (15 mg Intravenous Given 04/20/19 2117)  ondansetron (ZOFRAN) injection 4 mg (4 mg Intravenous Given 04/20/19 2117)    ED Course  I have reviewed the triage vital signs and the nursing notes.  Pertinent labs & imaging results that were available during my care of the patient were reviewed by me and considered in my medical decision making (  see chart for details).  49 year old female with acid reflux symptoms, headache and dizziness.  Patient reports symptoms have been going on for 15 days since stopping omeprazole however it sounds like symptoms could be more chronic in nature.  It is difficult to tell and there is a language barrier although Spanish interpreter was used.  Vital signs are normal.  Her neurologic exam is normal.  Heart is regular rate and rhythm.  Lungs are clear to auscultation.  Abdomen is soft and nontender.  Will obtain labs, CT head, provide symptomatic control  Labs are remarkable for mild hypokalemia and mild anemia.  CT head is negative.  On reexam she feels much better.  She states that she stopped the omeprazole because she was worried about long-term effects but has had a rebound of her GERD symptoms.  We discussed that every medicine has side effects but it sounds like the benefit of taking this is outweighing the risks and she was encouraged to restart this medicine.  She is also encouraged to take Tylenol as needed for headache and to follow-up with her doctor regarding her headaches.  MDM Rules/Calculators/A&P                       Final Clinical Impression(s) / ED Diagnoses Final diagnoses:  Bad headache    Gastroesophageal reflux disease, unspecified whether esophagitis present    Rx / DC Orders ED Discharge Orders    None       Bethel Born, PA-C 04/20/19 2338    Mancel Bale, MD 04/21/19 [phone]

## 2019-05-06 ENCOUNTER — Other Ambulatory Visit: Payer: Self-pay

## 2019-05-06 ENCOUNTER — Encounter (HOSPITAL_COMMUNITY): Payer: Self-pay | Admitting: Emergency Medicine

## 2019-05-06 ENCOUNTER — Emergency Department (HOSPITAL_COMMUNITY)
Admission: EM | Admit: 2019-05-06 | Discharge: 2019-05-06 | Disposition: A | Discharge status: Home / Self Care | Payer: Self-pay | Attending: Emergency Medicine | Admitting: Emergency Medicine

## 2019-05-06 DIAGNOSIS — R42 Dizziness and giddiness: Secondary | ICD-10-CM | POA: Insufficient documentation

## 2019-05-06 DIAGNOSIS — N926 Irregular menstruation, unspecified: Secondary | ICD-10-CM | POA: Insufficient documentation

## 2019-05-06 LAB — CBC
HCT: 35.7 % — ABNORMAL LOW (ref 36.0–46.0)
Hemoglobin: 10.9 g/dL — ABNORMAL LOW (ref 12.0–15.0)
MCH: 24.6 pg — ABNORMAL LOW (ref 26.0–34.0)
MCHC: 30.5 g/dL (ref 30.0–36.0)
MCV: 80.6 fL (ref 80.0–100.0)
Platelets: 309 10*3/uL (ref 150–400)
RBC: 4.43 MIL/uL (ref 3.87–5.11)
RDW: 16.9 % — ABNORMAL HIGH (ref 11.5–15.5)
WBC: 9.1 10*3/uL (ref 4.0–10.5)
nRBC: 0 % (ref 0.0–0.2)

## 2019-05-06 LAB — I-STAT BETA HCG BLOOD, ED (MC, WL, AP ONLY): I-stat hCG, quantitative: <5 m[IU]/mL (ref <5)

## 2019-05-06 LAB — URINALYSIS, ROUTINE W REFLEX MICROSCOPIC
Bilirubin Urine: NEGATIVE
Glucose, UA: NEGATIVE mg/dL
Ketones, ur: NEGATIVE mg/dL
Leukocytes,Ua: NEGATIVE
Nitrite: NEGATIVE
Protein, ur: NEGATIVE mg/dL
Specific Gravity, Urine: 1.023 (ref 1.005–1.030)
pH: 5 (ref 5.0–8.0)

## 2019-05-06 LAB — COMPREHENSIVE METABOLIC PANEL
ALT: 30 U/L (ref 0–44)
AST: 26 U/L (ref 15–41)
Albumin: 3.8 g/dL (ref 3.5–5.0)
Alkaline Phosphatase: 121 U/L (ref 38–126)
Anion gap: 9 (ref 5–15)
BUN: 14 mg/dL (ref 6–20)
CO2: 25 mmol/L (ref 22–32)
Calcium: 8.8 mg/dL — ABNORMAL LOW (ref 8.9–10.3)
Chloride: 105 mmol/L (ref 98–111)
Creatinine, Ser: 0.56 mg/dL (ref 0.44–1.00)
GFR calc Af Amer: >60 mL/min (ref >60)
GFR calc non Af Amer: >60 mL/min (ref >60)
Glucose, Bld: 104 mg/dL — ABNORMAL HIGH (ref 70–99)
Potassium: 3.6 mmol/L (ref 3.5–5.1)
Sodium: 139 mmol/L (ref 135–145)
Total Bilirubin: 0.4 mg/dL (ref 0.3–1.2)
Total Protein: 7.4 g/dL (ref 6.5–8.1)

## 2019-05-06 LAB — LIPASE, BLOOD: Lipase: 30 U/L (ref 11–51)

## 2019-05-06 NOTE — ED Provider Notes (Signed)
[facility]-EMERGENCY DEPT Provider Note   CSN: [id number] Arrival date & time: 05/06/19  1558     History Chief Complaint  Patient presents with  . Dizziness  . Abdominal Pain    Christina Vance is a 49 y.o. female.  49 year old female with prior medical history as detailed below presents for evaluation abdominal pain.  Patient reports that her last period was in early February.  She reports intermittent abdominal cramping since.  She has done multiple pregnancy test at home that were negative.  She does report that her menses have been somewhat more irregular over the last months.  She denies current abdominal pain.  She denies associated fever, nausea, vomiting, bowel movement change, urinary change, or other specific complaint.    The history is provided by the patient and medical records. The history is limited by a language barrier. A language interpreter was used.  Abdominal Pain Pain location:  Generalized Pain quality: aching, bloating and cramping   Pain radiates to:  Does not radiate Pain severity:  No pain Onset quality:  Gradual Duration:  3 weeks Timing:  Intermittent Progression:  Resolved Chronicity:  New Relieved by:  Nothing Worsened by:  Nothing Ineffective treatments:  None tried      Past Medical History:  Diagnosis Date  . Asthma   . Gastritis     There are no problems to display for this patient.   History reviewed. No pertinent surgical history.   OB History   No obstetric history on file.     No family history on file.  Social History   Tobacco Use  . Smoking status: Never Smoker  . Smokeless tobacco: Never Used  Substance Use Topics  . Alcohol use: No  . Drug use: Never    Home Medications Prior to Admission medications   Medication Sig Start Date End Date Taking? Authorizing Provider  acetaminophen (TYLENOL) 500 MG tablet Take 500 mg by mouth daily as needed for headache.   Yes [provider]  omeprazole (PRILOSEC) 20 MG capsule Take 1 capsule (20 mg total) by mouth daily. Patient not taking: Reported on 08/27/2017 05/08/13   Elpidio Anis, PA-C  predniSONE (DELTASONE) 20 MG tablet Take 2 tablets (40 mg total) by mouth daily. Patient not taking: Reported on 08/27/2017 05/15/15   Roxy Horseman, PA-C  diphenhydrAMINE (BENADRYL) 25 MG tablet Take 1 tablet (25 mg total) by mouth every 6 (six) hours as needed for itching (Rash). Patient not taking: Reported on 08/27/2017 05/15/15 04/20/19  Roxy Horseman, PA-C    Allergies    Ibuprofen and Penicillins  Review of Systems   Review of Systems  Gastrointestinal: Positive for abdominal pain.  All other systems reviewed and are negative.   Physical Exam Updated Vital Signs BP 111/75 (BP Location: Right Arm)   Pulse 81   Temp 98.4 F (36.9 C) (Oral)   Resp 17   LMP 03/14/2019   SpO2 100%   Physical Exam Vitals and nursing note reviewed.  Constitutional:      General: She is not in acute distress.    Appearance: She is well-developed.  HENT:     Head: Normocephalic and atraumatic.  Eyes:     Conjunctiva/sclera: Conjunctivae normal.     Pupils: Pupils are equal, round, and reactive to light.  Cardiovascular:     Rate and Rhythm: Normal rate and regular rhythm.     Heart sounds: Normal heart sounds.  Pulmonary:     Effort: Pulmonary  effort is normal. No respiratory distress.     Breath sounds: Normal breath sounds.  Abdominal:     General: Abdomen is flat. There is no distension.     Palpations: Abdomen is soft.     Tenderness: There is no abdominal tenderness.  Musculoskeletal:        General: No deformity. Normal range of motion.     Cervical back: Normal range of motion and neck supple.  Skin:    General: Skin is warm and dry.  Neurological:     Mental Status: She is alert and oriented to person, place, and time.     ED Results / Procedures / Treatments   Labs (all labs ordered are listed, but  only abnormal results are displayed) Labs Reviewed  COMPREHENSIVE METABOLIC PANEL - Abnormal; Notable for the following components:      Result Value   Glucose, Bld 104 (*)    Calcium 8.8 (*)    All other components within normal limits  CBC - Abnormal; Notable for the following components:   Hemoglobin 10.9 (*)    HCT 35.7 (*)    MCH 24.6 (*)    RDW 16.9 (*)    All other components within normal limits  URINALYSIS, ROUTINE W REFLEX MICROSCOPIC - Abnormal; Notable for the following components:   Hgb urine dipstick SMALL (*)    Bacteria, UA RARE (*)    All other components within normal limits  LIPASE, BLOOD  I-STAT BETA HCG BLOOD, ED ([facility], [facility], [facility] ONLY)    EKG None  Radiology No results found.  Procedures Procedures (including critical care time)  Medications Ordered in ED Medications - No data to display  ED Course  I have reviewed the triage vital signs and the nursing notes.  Pertinent labs & imaging results that were available during my care of the patient were reviewed by me and considered in my medical decision making (see chart for details).    MDM Rules/Calculators/A&P                      MDM  Screen complete  Christina Vance was evaluated in Emergency Department on 05/06/2019 for the symptoms described in the history of present illness. She was evaluated in the context of the global COVID-19 pandemic, which necessitated consideration that the patient might be at risk for infection with the SARS-CoV-2 virus that causes COVID-19. Institutional protocols and algorithms that pertain to the evaluation of patients at risk for COVID-19 are in a state of rapid change based on information released by regulatory bodies including the CDC and federal and state organizations. These policies and algorithms were followed during the patient's care in the ED.  Patient is presenting for evaluation of reported abdominal discomfort.  This is associated with menstrual  irregularity.  Patient's work-up does not suggest significant acute pathology.  I suspect that her symptoms of likely secondary to reported menstrual regularity.  Importance of close follow-up is stressed.  Strict return cautions given and understood. Final Clinical Impression(s) / ED Diagnoses Final diagnoses:  Irregular menses    Rx / DC Orders ED Discharge Orders    None       Valarie Merino, MD 05/06/19 2031

## 2019-05-06 NOTE — ED Notes (Signed)
Pt ambulatory to BR with steady gait and able to provide urine sample. Pt back on the monitor at this time and has call bell within reach.

## 2019-05-06 NOTE — ED Triage Notes (Signed)
Spanish interpreter used, Daphine Deutscher [phone].  Got menstrual in Jan. Menstrual not come in Feb or this month and taken pregnancy test at home that were negative. Pt c/o tightness in abd and dizziness for a month. Pt has been urinating more frequently than normal. Has nausea but no vomiting. Was seen for gastritis and was given medications that helped.

## 2019-05-06 NOTE — ED Notes (Signed)
PT given discharge instructions in spanish. Pt denies any questions or concerns. PT A/Ox4 and ambulatory at discharge.

## 2019-05-06 NOTE — Discharge Instructions (Addendum)
Please return for any problem.  Follow-up with your regular care provider as instructed.  Follow up with GYN as instructed.  Use Tylenol (500-1000mg ) orally every 8 hours for pain.

## 2019-09-18 ENCOUNTER — Emergency Department (HOSPITAL_COMMUNITY)
Admission: EM | Admit: 2019-09-18 | Discharge: 2019-09-19 | Disposition: A | Discharge status: Home / Self Care | Payer: Self-pay | Attending: Emergency Medicine | Admitting: Emergency Medicine

## 2019-09-18 ENCOUNTER — Encounter (HOSPITAL_COMMUNITY): Payer: Self-pay | Admitting: Pharmacy Technician

## 2019-09-18 ENCOUNTER — Other Ambulatory Visit: Payer: Self-pay

## 2019-09-18 DIAGNOSIS — J45909 Unspecified asthma, uncomplicated: Secondary | ICD-10-CM | POA: Insufficient documentation

## 2019-09-18 DIAGNOSIS — R42 Dizziness and giddiness: Secondary | ICD-10-CM | POA: Insufficient documentation

## 2019-09-18 LAB — COMPREHENSIVE METABOLIC PANEL
ALT: 26 U/L (ref 0–44)
AST: 28 U/L (ref 15–41)
Albumin: 3.3 g/dL — ABNORMAL LOW (ref 3.5–5.0)
Alkaline Phosphatase: 113 U/L (ref 38–126)
Anion gap: 11 (ref 5–15)
BUN: 11 mg/dL (ref 6–20)
CO2: 24 mmol/L (ref 22–32)
Calcium: 8.6 mg/dL — ABNORMAL LOW (ref 8.9–10.3)
Chloride: 104 mmol/L (ref 98–111)
Creatinine, Ser: 0.78 mg/dL (ref 0.44–1.00)
GFR calc Af Amer: >60 mL/min (ref >60)
GFR calc non Af Amer: >60 mL/min (ref >60)
Glucose, Bld: 88 mg/dL (ref 70–99)
Potassium: 3.6 mmol/L (ref 3.5–5.1)
Sodium: 139 mmol/L (ref 135–145)
Total Bilirubin: 0.2 mg/dL — ABNORMAL LOW (ref 0.3–1.2)
Total Protein: 6.9 g/dL (ref 6.5–8.1)

## 2019-09-18 LAB — URINALYSIS, ROUTINE W REFLEX MICROSCOPIC
Bilirubin Urine: NEGATIVE
Glucose, UA: NEGATIVE mg/dL
Ketones, ur: NEGATIVE mg/dL
Leukocytes,Ua: NEGATIVE
Nitrite: NEGATIVE
Protein, ur: NEGATIVE mg/dL
Specific Gravity, Urine: 1.011 (ref 1.005–1.030)
pH: 6 (ref 5.0–8.0)

## 2019-09-18 LAB — CBC
HCT: 33.5 % — ABNORMAL LOW (ref 36.0–46.0)
Hemoglobin: 9.9 g/dL — ABNORMAL LOW (ref 12.0–15.0)
MCH: 23.4 pg — ABNORMAL LOW (ref 26.0–34.0)
MCHC: 29.6 g/dL — ABNORMAL LOW (ref 30.0–36.0)
MCV: 79.2 fL — ABNORMAL LOW (ref 80.0–100.0)
Platelets: 291 10*3/uL (ref 150–400)
RBC: 4.23 MIL/uL (ref 3.87–5.11)
RDW: 18 % — ABNORMAL HIGH (ref 11.5–15.5)
WBC: 8.4 10*3/uL (ref 4.0–10.5)
nRBC: 0 % (ref 0.0–0.2)

## 2019-09-18 LAB — LIPASE, BLOOD: Lipase: 35 U/L (ref 11–51)

## 2019-09-18 MED ORDER — SODIUM CHLORIDE 0.9% FLUSH: 3.0000 mL | Freq: Once | INTRAVENOUS | Status: DC

## 2019-09-18 NOTE — ED Triage Notes (Addendum)
Per interpreter, pt with dizziness/near syncope X 15 days. Pt states the feeling is constant but worsens when she eats. Pt also c/o hand and feet swelling along with "gallbladder" pain. Pt states the gallbladder pain is almost resolved due to dietary changes.

## 2019-09-19 LAB — I-STAT BETA HCG BLOOD, ED (MC, WL, AP ONLY): I-stat hCG, quantitative: <5 m[IU]/mL (ref <5)

## 2019-09-19 NOTE — ED Notes (Signed)
Patient verbalizes understanding of discharge instructions. Opportunity for questioning and answers were provided. Armband removed by staff, pt discharged from ED   Electronic interpreter [facility] ID# [phone]) used in this d/c

## 2019-09-19 NOTE — ED Provider Notes (Signed)
[facility] EMERGENCY DEPARTMENT Provider Note  CSN: [id number] Arrival date & time: 09/18/19 1743  Chief Complaint(s) Dizziness  HPI Christina Vance is a 48 y.o. female   The history is provided by the patient.  Dizziness Quality:  Head spinning and lightheadedness Severity:  Moderate Onset quality:  Gradual Duration:  15 days Timing:  Intermittent Progression:  Waxing and waning (currently nearly resolved) Chronicity:  New Context comment:  Standing for prolonged period of time Relieved by:  Being still Worsened by:  Movement and turning head Associated symptoms: no blood in stool, no chest pain, no headaches, no palpitations, no shortness of breath, no vision changes, no vomiting and no weakness     Past Medical History Past Medical History:  Diagnosis Date  . Asthma   . Gastritis    There are no problems to display for this patient.  Home Medication(s) Prior to Admission medications   Medication Sig Start Date End Date Taking? Authorizing Provider  acetaminophen (TYLENOL) 500 MG tablet Take 500 mg by mouth daily as needed for headache.   Yes [provider]  famotidine (PEPCID) 20 MG tablet Take 20 mg by mouth in the morning and at bedtime.   Yes [provider]  meclizine (ANTIVERT) 25 MG tablet Take 25 mg by mouth 3 (three) times daily as needed for dizziness.  09/14/19  Yes [provider]  omeprazole (PRILOSEC) 20 MG capsule Take 1 capsule (20 mg total) by mouth daily. Patient not taking: Reported on 08/27/2017 05/08/13   Elpidio Anis, PA-C  predniSONE (DELTASONE) 20 MG tablet Take 2 tablets (40 mg total) by mouth daily. Patient not taking: Reported on 08/27/2017 05/15/15   Roxy Horseman, PA-C  diphenhydrAMINE (BENADRYL) 25 MG tablet Take 1 tablet (25 mg total) by mouth every 6 (six) hours as needed for itching (Rash). Patient not taking: Reported on 08/27/2017 05/15/15 04/20/19  Roxy Horseman, PA-C                                                                                                                                     Past Surgical History History reviewed. No pertinent surgical history. Family History No family history on file.  Social History Social History   Tobacco Use  . Smoking status: Never Smoker  . Smokeless tobacco: Never Used  Substance Use Topics  . Alcohol use: No  . Drug use: Never   Allergies Ibuprofen and Penicillins  Review of Systems Review of Systems  Respiratory: Negative for shortness of breath.   Cardiovascular: Negative for chest pain and palpitations.  Gastrointestinal: Negative for blood in stool and vomiting.  Neurological: Positive for dizziness. Negative for weakness and headaches.   All other systems are reviewed and are negative for acute change except as noted in the HPI  Physical Exam Vital Signs  I have reviewed the triage vital signs BP 109/81   Pulse 67  Temp 98.6 F (37 C)   Resp 15   SpO2 99%   Physical Exam Vitals reviewed.  Constitutional:      General: She is not in acute distress.    Appearance: She is well-developed. She is not diaphoretic.  HENT:     Head: Normocephalic and atraumatic.     Nose: Nose normal.  Eyes:     General: No scleral icterus.       Right eye: No discharge.        Left eye: No discharge.     Conjunctiva/sclera: Conjunctivae normal.     Pupils: Pupils are equal, round, and reactive to light.  Cardiovascular:     Rate and Rhythm: Normal rate and regular rhythm.     Heart sounds: No murmur heard.  No friction rub. No gallop.   Pulmonary:     Effort: Pulmonary effort is normal. No respiratory distress.     Breath sounds: Normal breath sounds. No stridor. No rales.  Abdominal:     General: There is no distension.     Palpations: Abdomen is soft.     Tenderness: There is no abdominal tenderness.  Musculoskeletal:        General: No tenderness.     Cervical back: Normal range of motion and neck  supple.  Skin:    General: Skin is warm and dry.     Findings: No erythema or rash.  Neurological:     Mental Status: She is alert and oriented to person, place, and time.     Comments: Mental Status:  Alert and oriented to person, place, and time.  Attention and concentration normal.  Speech clear.  Recent memory is intact  Cranial Nerves:  II Visual Fields: Intact to confrontation. Visual fields intact. III, IV, VI: Pupils equal and reactive to light and near. Full eye movement without nystagmus  V Facial Sensation: Normal. No weakness of masticatory muscles  VII: No facial weakness or asymmetry  VIII Auditory Acuity: Grossly normal  IX/X: The uvula is midline; the palate elevates symmetrically  XI: Normal sternocleidomastoid and trapezius strength  XII: The tongue is midline. No atrophy or fasciculations.   Motor System: Muscle Strength: 5/5 and symmetric in the upper and lower extremities. No pronation or drift.  Muscle Tone: Tone and muscle bulk are normal in the upper and lower extremities.   Reflexes: DTRs: 1+ and symmetrical in all four extremities.  Coordination: . No tremor.  Sensation: Intact to light touch.  Gait: Routine gait normal.      ED Results and Treatments Labs (all labs ordered are listed, but only abnormal results are displayed) Labs Reviewed  CBC - Abnormal; Notable for the following components:      Result Value   Hemoglobin 9.9 (*)    HCT 33.5 (*)    MCV 79.2 (*)    MCH 23.4 (*)    MCHC 29.6 (*)    RDW 18.0 (*)    All other components within normal limits  URINALYSIS, ROUTINE W REFLEX MICROSCOPIC - Abnormal; Notable for the following components:   Hgb urine dipstick SMALL (*)    Bacteria, UA RARE (*)    All other components within normal limits  COMPREHENSIVE METABOLIC PANEL - Abnormal; Notable for the following components:   Calcium 8.6 (*)    Albumin 3.3 (*)    Total Bilirubin 0.2 (*)    All other components within normal limits    LIPASE, BLOOD  I-STAT BETA HCG BLOOD, ED ([facility],  [facility], [facility] ONLY)  I-STAT BETA HCG BLOOD, ED ([facility], [facility], [facility] ONLY)                                                                                                                         EKG  EKG Interpretation  Date/Time:  Friday September 18 2019 17:53:30 EDT Ventricular Rate:  84 PR Interval:  124 QRS Duration: 82 QT Interval:  364 QTC Calculation: 430 R Axis:   13 Text Interpretation: Normal sinus rhythm with sinus arrhythmia Normal ECG No significant change since last tracing Confirmed by Tilden Fossaees, Elizabeth [phone]([id number]) on 09/18/2019 5:58:34 PM      Radiology No results found.  Pertinent labs & imaging results that were available during my care of the patient were reviewed by me and considered in my medical decision making (see chart for details).  Medications Ordered in ED Medications - No data to display                                                                                                                                  Procedures Procedures  (including critical care time)  Medical Decision Making / ED Course I have reviewed the nursing notes for this encounter and the patient's prior records (if available in EHR or on provided paperwork).   Christina Vance was evaluated in Emergency Department on 09/21/2019 for the symptoms described in the history of present illness. She was evaluated in the context of the global COVID-19 pandemic, which necessitated consideration that the patient might be at risk for infection with the SARS-CoV-2 virus that causes COVID-19. Institutional protocols and algorithms that pertain to the evaluation of patients at risk for COVID-19 are in a state of rapid change based on information released by regulatory bodies including the CDC and federal and state organizations. These policies and algorithms were followed during the patient's care in the ED.  Work-up was grossly reassuring. No significant  electrolyte derangement or renal insufficiency. EKG without dysrhythmia, blocks or evidence of ischemia. Beta-hCG negative. UA without evidence of infection. CBC was notable for microcytic anemia.  Patient reported having irregular periods over the past several months.  Last menstrual cycle was 2 months ago. Patient has not been on her iron supplements.  Recommend she go back on.  Follow-up with PCP.      Final Clinical Impression(s) / ED Diagnoses Final diagnoses:  Lightheadedness   The  patient appears reasonably screened and/or stabilized for discharge and I doubt any other medical condition or other [facility] requiring further screening, evaluation, or treatment in the ED at this time prior to discharge. Safe for discharge with strict return precautions.  Disposition: Discharge  Condition: Good  I have discussed the results, Dx and Tx plan with the patient/family who expressed understanding and agree(s) with the plan. Discharge instructions discussed at length. The patient/family was given strict return precautions who verbalized understanding of the instructions. No further questions at time of discharge.    ED Discharge Orders    None       Follow Up: Primary care provider  Schedule an appointment as soon as possible for a visit  If you do not have a primary care physician, contact [facility] at [phone] for referral    This chart was dictated using voice recognition software.  Despite best efforts to proofread,  errors can occur which can change the documentation meaning.   Nira Conn, MD 09/21/19 [phone]

## 2019-10-19 NOTE — Progress Notes (Deleted)
Patient ID: Christina Vance, female   DOB: 05-22-1970, 49 y.o.   MRN: [id number]   After 09/18/2019 ED visit: Christina Vance was evaluated in Emergency Department on 09/21/2019 for the symptoms described in the history of present illness. She was evaluated in the context of the global COVID-19 pandemic, which necessitated consideration that the patient might be at risk for infection with the SARS-CoV-2 virus that causes COVID-19. Institutional protocols and algorithms that pertain to the evaluation of patients at risk for COVID-19 are in a state of rapid change based on information released by regulatory bodies including the CDC and federal and state organizations. These policies and algorithms were followed during the patient's care in the ED.  Work-up was grossly reassuring. No significant electrolyte derangement or renal insufficiency. EKG without dysrhythmia, blocks or evidence of ischemia. Beta-hCG negative. UA without evidence of infection. CBC was notable for microcytic anemia.  Patient reported having irregular periods over the past several months.  Last menstrual cycle was 2 months ago. Patient has not been on her iron supplements.  Recommend she go back on.  Follow-up with PCP.

## 2019-10-21 ENCOUNTER — Ambulatory Visit: Payer: Self-pay | Attending: Physician Assistant | Admitting: Physician Assistant

## 2019-10-21 ENCOUNTER — Other Ambulatory Visit: Payer: Self-pay

## 2019-11-12 ENCOUNTER — Other Ambulatory Visit: Payer: Self-pay

## 2019-11-12 ENCOUNTER — Encounter (HOSPITAL_COMMUNITY): Payer: Self-pay

## 2019-11-12 ENCOUNTER — Emergency Department (HOSPITAL_COMMUNITY)
Admission: EM | Admit: 2019-11-12 | Discharge: 2019-11-12 | Disposition: A | Discharge status: Home / Self Care | Payer: HRSA Program | Attending: Emergency Medicine | Admitting: Emergency Medicine

## 2019-11-12 DIAGNOSIS — B349 Viral infection, unspecified: Secondary | ICD-10-CM

## 2019-11-12 DIAGNOSIS — R1110 Vomiting, unspecified: Secondary | ICD-10-CM | POA: Diagnosis present

## 2019-11-12 DIAGNOSIS — U071 COVID-19: Secondary | ICD-10-CM | POA: Insufficient documentation

## 2019-11-12 DIAGNOSIS — Z79899 Other long term (current) drug therapy: Secondary | ICD-10-CM | POA: Insufficient documentation

## 2019-11-12 DIAGNOSIS — J45909 Unspecified asthma, uncomplicated: Secondary | ICD-10-CM | POA: Diagnosis not present

## 2019-11-12 LAB — SARS CORONAVIRUS 2 BY RT PCR (HOSPITAL ORDER, PERFORMED IN ~~LOC~~ HOSPITAL LAB): SARS Coronavirus 2: POSITIVE — AB

## 2019-11-12 MED ORDER — ONDANSETRON 4 MG PO TBDP: 4.0000 mg | ORAL_TABLET | Freq: Three times a day (TID) | ORAL | 0 refills | Status: DC | PRN

## 2019-11-12 MED ORDER — PREDNISONE 10 MG PO TABS
50.0000 mg | ORAL_TABLET | Freq: Every day | ORAL | 0 refills | Status: AC
Start: 2019-11-12 — End: 2019-11-17

## 2019-11-12 MED ORDER — ALBUTEROL SULFATE HFA 108 (90 BASE) MCG/ACT IN AERS: 2.0000 | INHALATION_SPRAY | Freq: Four times a day (QID) | RESPIRATORY_TRACT | 3 refills | Status: DC | PRN

## 2019-11-12 MED ORDER — DOXYCYCLINE HYCLATE 100 MG PO CAPS
100.0000 mg | ORAL_CAPSULE | Freq: Two times a day (BID) | ORAL | 0 refills | Status: AC
Start: 2019-11-12 — End: 2019-11-19

## 2019-11-12 NOTE — ED Triage Notes (Signed)
Paitent c/o fever, headache, emesis, and cough x 8 days.

## 2019-11-12 NOTE — ED Provider Notes (Signed)
[facility]-EMERGENCY DEPT Provider Note   CSN: [id number] Arrival date & time: 11/12/19  1309     History Chief Complaint  Patient presents with  . Emesis    Christina Vance is a 49 y.o. female with a history of asthma presented emergency department with viral syndrome for the past 8 days.  The patient reports that her husband tested positive for Covid approximately 15 days ago, and her daughter been spending time with her husband prior to coming into her house.  The daughter is asymptomatic.  The patient is unvaccinated for Covid.  She reports for the past 8 days, she has had a headache, back pain, muscle aches, nausea, sore throat, headache, fevers and chills.  She denies dysuria.  She reports poor appetite and occasional vomiting.  She has been taking Tylenol, naproxen, and aspirin for fever and muscle aches.  She feels like she is having bad acid reflux and epigastric pain now.  She denies any wheezing or difficulty breathing.  She does report a persistent cough.  She says she ran out of her albuterol inhaler.  Allergies to penicillins and motrin PMHx:  Asthma Meds:  No chronic meds  A Spanish translator was used for my full interview  HPI     Past Medical History:  Diagnosis Date  . Asthma   . Gastritis     There are no problems to display for this patient.   Past Surgical History:  Procedure Laterality Date  . CESAREAN SECTION       OB History   No obstetric history on file.     Family History  Problem Relation Age of Onset  . Diabetes Mother     Social History   Tobacco Use  . Smoking status: Never Smoker  . Smokeless tobacco: Never Used  Vaping Use  . Vaping Use: Never used  Substance Use Topics  . Alcohol use: No  . Drug use: Never    Home Medications Prior to Admission medications   Medication Sig Start Date End Date Taking? Authorizing Provider  acetaminophen (TYLENOL) 500 MG tablet Take 500 mg by mouth  daily as needed for headache.    [provider]  albuterol (VENTOLIN HFA) 108 (90 Base) MCG/ACT inhaler Inhale 2 puffs into the lungs every 6 (six) hours as needed for wheezing or shortness of breath. 11/12/19   Terald Sleeper, MD  doxycycline (VIBRAMYCIN) 100 MG capsule Take 1 capsule (100 mg total) by mouth 2 (two) times daily for 7 days. 11/12/19 11/19/19  Terald Sleeper, MD  famotidine (PEPCID) 20 MG tablet Take 20 mg by mouth in the morning and at bedtime.    [provider]  meclizine (ANTIVERT) 25 MG tablet Take 25 mg by mouth 3 (three) times daily as needed for dizziness.  09/14/19   [provider]  omeprazole (PRILOSEC) 20 MG capsule Take 1 capsule (20 mg total) by mouth daily. Patient not taking: Reported on 08/27/2017 05/08/13   Elpidio Anis, PA-C  ondansetron (ZOFRAN ODT) 4 MG disintegrating tablet Take 1 tablet (4 mg total) by mouth every 8 (eight) hours as needed for up to 10 doses for nausea or vomiting. 11/12/19   Terald Sleeper, MD  predniSONE (DELTASONE) 10 MG tablet Take 5 tablets (50 mg total) by mouth daily for 5 days. 11/12/19 11/17/19  Terald Sleeper, MD  predniSONE (DELTASONE) 20 MG tablet Take 2 tablets (40 mg total) by mouth daily. Patient not taking: Reported on 08/27/2017  05/15/15   Roxy Horseman, PA-C  diphenhydrAMINE (BENADRYL) 25 MG tablet Take 1 tablet (25 mg total) by mouth every 6 (six) hours as needed for itching (Rash). Patient not taking: Reported on 08/27/2017 05/15/15 04/20/19  Roxy Horseman, PA-C    Allergies    Ibuprofen, Other, and Penicillins  Review of Systems   Review of Systems  Constitutional: Positive for chills and fever.  HENT: Positive for congestion and sore throat.   Eyes: Negative for pain and visual disturbance.  Respiratory: Positive for cough. Negative for shortness of breath.   Cardiovascular: Negative for chest pain and palpitations.  Gastrointestinal: Positive for nausea and vomiting. Negative for  abdominal pain and constipation.  Genitourinary: Negative for dysuria and hematuria.  Musculoskeletal: Positive for arthralgias and myalgias.  Skin: Negative for rash and wound.  Neurological: Positive for light-headedness and headaches. Negative for syncope.  Psychiatric/Behavioral: Negative for agitation and confusion.  All other systems reviewed and are negative.   Physical Exam Updated Vital Signs BP 127/85   Pulse (!) 103   Temp 98 F (36.7 C)   Resp 19   Wt 81.6 kg   LMP 09/11/2019 (Approximate)   SpO2 97%   BMI 32.92 kg/m   Physical Exam Vitals and nursing note reviewed.  Constitutional:      General: She is not in acute distress.    Appearance: She is well-developed.  HENT:     Head: Normocephalic and atraumatic.  Eyes:     Conjunctiva/sclera: Conjunctivae normal.     Pupils: Pupils are equal, round, and reactive to light.  Cardiovascular:     Rate and Rhythm: Normal rate and regular rhythm.     Pulses: Normal pulses.  Pulmonary:     Effort: Pulmonary effort is normal. No respiratory distress.     Breath sounds: No wheezing.     Comments: 95% on room air Crackles in left lower lobe No wheezing Abdominal:     General: There is no distension.     Palpations: Abdomen is soft.     Tenderness: There is no abdominal tenderness.  Musculoskeletal:     Cervical back: Neck supple.  Skin:    General: Skin is warm and dry.  Neurological:     General: No focal deficit present.     Mental Status: She is alert and oriented to person, place, and time.  Psychiatric:        Mood and Affect: Mood normal.        Behavior: Behavior normal.     ED Results / Procedures / Treatments   Labs (all labs ordered are listed, but only abnormal results are displayed) Labs Reviewed  SARS CORONAVIRUS 2 BY RT PCR (HOSPITAL ORDER, PERFORMED IN [facility]) - Abnormal; Notable for the following components:      Result Value   SARS Coronavirus 2 POSITIVE (*)    All  other components within normal limits    EKG None  Radiology No results found.  Procedures Procedures (including critical care time)  Medications Ordered in ED Medications - No data to display  ED Course  I have reviewed the triage vital signs and the nursing notes.  Pertinent labs & imaging results that were available during my care of the patient were reviewed by me and considered in my medical decision making (see chart for details).  This is a 60 female presenting with viral type syndrome.  This clinical presentation is highly concerning for Covid infection.  We will swab her  for Covid.  I think we can treat her empirically with a course of steroids, will also offer doxycycline given that she does have crackles in the left lower lobe, to treat for possible superimposed pneumonia.  I can refill her albuterol prescription as well.  She is not hypoxic currently and breathing comfortably.  We talked about the expected course of illness, which may extend 10 to 14 days with her acute symptoms.  Advised that she quarantine at home in the meantime.  Otherwise, I have a lower suspicion for bacterial sepsis, meningitis, or pulmonary embolism.  Christina Vance was evaluated in Emergency Department on 11/13/2019 for the symptoms described in the history of present illness. She was evaluated in the context of the global COVID-19 pandemic, which necessitated consideration that the patient might be at risk for infection with the SARS-CoV-2 virus that causes COVID-19. Institutional protocols and algorithms that pertain to the evaluation of patients at risk for COVID-19 are in a state of rapid change based on information released by regulatory bodies including the CDC and federal and state organizations. These policies and algorithms were followed during the patient's care in the ED.     Final Clinical Impression(s) / ED Diagnoses Final diagnoses:  Viral illness    Rx / DC Orders ED  Discharge Orders         Ordered    predniSONE (DELTASONE) 10 MG tablet  Daily        11/12/19 1721    doxycycline (VIBRAMYCIN) 100 MG capsule  2 times daily        11/12/19 1721    ondansetron (ZOFRAN ODT) 4 MG disintegrating tablet  Every 8 hours PRN        11/12/19 1721    albuterol (VENTOLIN HFA) 108 (90 Base) MCG/ACT inhaler  Every 6 hours PRN        11/12/19 1721           Terald Sleeper, MD 11/13/19 [phone]

## 2019-11-12 NOTE — Discharge Instructions (Signed)
You can take tylenol 650 mg every 6 hours for fever and pains.  Do not take aspirin or naproxyn because this will irritate your stomach more.

## 2019-11-13 ENCOUNTER — Telehealth: Payer: Self-pay | Admitting: Unknown Physician Specialty

## 2019-11-13 ENCOUNTER — Other Ambulatory Visit: Payer: Self-pay | Admitting: Unknown Physician Specialty

## 2019-11-13 DIAGNOSIS — E663 Overweight: Secondary | ICD-10-CM

## 2019-11-13 DIAGNOSIS — U071 COVID-19: Secondary | ICD-10-CM

## 2019-11-13 NOTE — Telephone Encounter (Signed)
With an interpretor, II connected by phone with Christina Vance on 11/13/2019 at 3:23 PM to discuss the potential use of a new treatment for mild to moderate COVID-19 viral infection in non-hospitalized patients.  This patient is a 49 y.o. female that meets the FDA criteria for Emergency Use Authorization of COVID monoclonal antibody casirivimab/imdevimab.  Has a (+) direct SARS-CoV-2 viral test result  Has mild or moderate COVID-19   Is NOT hospitalized due to COVID-19  Is within 10 days of symptom onset  Has at least one of the high risk factor(s) for progression to severe COVID-19 and/or hospitalization as defined in EUA.  Specific high risk criteria : BMI > 25   I have spoken and communicated the following to the patient or parent/caregiver regarding COVID monoclonal antibody treatment:  1. FDA has authorized the emergency use for the treatment of mild to moderate COVID-19 in adults and pediatric patients with positive results of direct SARS-CoV-2 viral testing who are 72 years of age and older weighing at least 40 kg, and who are at high risk for progressing to severe COVID-19 and/or hospitalization.  2. The significant known and potential risks and benefits of COVID monoclonal antibody, and the extent to which such potential risks and benefits are unknown.  3. Information on available alternative treatments and the risks and benefits of those alternatives, including clinical trials.  4. Patients treated with COVID monoclonal antibody should continue to self-isolate and use infection control measures (e.g., wear mask, isolate, social distance, avoid sharing personal items, clean and disinfect "high touch" surfaces, and frequent handwashing) according to CDC guidelines.   5. The patient or parent/caregiver has the option to accept or refuse COVID monoclonal antibody treatment.  After reviewing this information with the patient, The patient agreed to proceed with receiving  casirivimab\imdevimab infusion and will be provided a copy of the Fact sheet prior to receiving the infusion. Gabriel Cirri 11/13/2019 3:23 PM  Day 9 of illness

## 2019-11-14 ENCOUNTER — Ambulatory Visit (HOSPITAL_COMMUNITY)
Admission: RE | Admit: 2019-11-14 | Discharge: 2019-11-14 | Disposition: A | Discharge status: Home / Self Care | Payer: HRSA Program | Source: Ambulatory Visit | Attending: Pulmonary Disease | Admitting: Pulmonary Disease

## 2019-11-14 ENCOUNTER — Other Ambulatory Visit: Payer: Self-pay | Admitting: Family

## 2019-11-14 DIAGNOSIS — U071 COVID-19: Secondary | ICD-10-CM

## 2019-11-14 DIAGNOSIS — E663 Overweight: Secondary | ICD-10-CM | POA: Diagnosis present

## 2019-11-14 MED ORDER — ALBUTEROL SULFATE HFA 108 (90 BASE) MCG/ACT IN AERS: 2.0000 | INHALATION_SPRAY | Freq: Once | RESPIRATORY_TRACT | Status: DC | PRN

## 2019-11-14 MED ORDER — METHYLPREDNISOLONE SODIUM SUCC 125 MG IJ SOLR: 125.0000 mg | Freq: Once | INTRAMUSCULAR | Status: DC | PRN

## 2019-11-14 MED ORDER — EPINEPHRINE 0.3 MG/0.3ML IJ SOAJ: 0.3000 mg | Freq: Once | INTRAMUSCULAR | Status: DC | PRN

## 2019-11-14 MED ORDER — FAMOTIDINE IN NACL 20-0.9 MG/50ML-% IV SOLN: 20.0000 mg | Freq: Once | INTRAVENOUS | Status: DC | PRN

## 2019-11-14 MED ORDER — DIPHENHYDRAMINE HCL 50 MG/ML IJ SOLN: 50.0000 mg | Freq: Once | INTRAMUSCULAR | Status: DC | PRN

## 2019-11-14 MED ORDER — SODIUM CHLORIDE 0.9 % IV SOLN
1200.0000 mg | Freq: Once | INTRAVENOUS | Status: AC
  Administered 2019-11-14: 1200 mg via INTRAVENOUS

## 2019-11-14 MED ORDER — SODIUM CHLORIDE 0.9 % IV SOLN: INTRAVENOUS | Status: DC | PRN

## 2019-11-14 NOTE — Discharge Instructions (Signed)
COVID-19 COVID-19 El COVID-19 es una infeccin respiratoria causada por un virus llamado coronavirus tipo 2 causante del sndrome respiratorio agudo grave (SARS-CoV-2). La enfermedad tambin se conoce como enfermedad por coronavirus o nuevo coronavirus. En algunas personas, el virus puede no ocasionar sntomas. En otras, puede producir una infeccin grave. La infeccin puede empeorar rpidamente y causar complicaciones, como:  Neumona o infeccin en los pulmones.  Sndrome de dificultad respiratoria aguda o SDRA. Es una afeccin que se caracteriza por la acumulacin de lquido en los pulmones, que impide que los pulmones se llenen de aire y pasen oxgeno a la sangre.  Insuficiencia respiratoria aguda. Es una afeccin que se caracteriza porque no pasa suficiente oxgeno de los pulmones al cuerpo o porque el dixido de carbono no pasa de los pulmones hacia afuera del cuerpo.  Sepsis o choque sptico. Se trata de una reaccin grave del cuerpo ante una infeccin.  Problemas de coagulacin.  Infecciones secundarias debido a bacterias u hongos.  Falla de rganos. Ocurre cuando los rganos del cuerpo dejan de funcionar. El virus que causa el COVID-19 es contagioso. Esto significa que puede transmitirse de una persona a otra a travs de las gotitas de saliva de la tos y de los estornudos (secreciones respiratorias). Cules son las causas? Esta enfermedad es causada por un virus. Usted puede contagiarse con este virus:  Al inspirar las gotitas de una persona infectada. Las gotitas pueden diseminarse cuando una persona respira, habla, canta, tose o estornuda.  Al tocar algo, como una mesa o el picaportes de una puerta, que estuvo expuesto al virus (contaminado) y luego tocarse la boca, nariz o los ojos. Qu incrementa el riesgo? Riesgo de infeccin Es ms probable que se infecte con este virus si:  Se encuentra a una distancia menor a 6 pies (2 metros) de una persona con COVID-19.  Cuida o  vive con una persona infectada con COVID-19.  Pasa tiempo en espacios interiores repletos de gente o vive en viviendas compartidas. Riesgo de enfermedad grave Es ms probable que se enferme gravemente por el virus si:  Tiene 50aos o ms. Cuanto mayor sea su edad, mayor ser el riesgo de tener una enfermedad grave.  Vive en un hogar de ancianos o centro de atencin a largo plazo.  Tiene cncer.  Tiene una enfermedad prolongada (crnica), como las siguientes: ? Enfermedad pulmonar crnica, que incluye la enfermedad pulmonar obstructiva crnica o asma. ? Una enfermedad crnica que disminuye la capacidad del cuerpo para combatir las infecciones (immunocomprometido). ? Enfermedad cardaca, que incluye insuficiencia cardaca, una afeccin que se caracteriza porque las arterias que llegan al corazn se estrechan u obstruyen (arteriopata coronaria) o una enfermedad que provoca que el msculo cardaco se engrose, se debilite o endurezca (miocardiopata). ? Diabetes. ? Enfermedad renal crnica. ? Anemia drepanoctica, una enfermedad que se caracteriza porque los glbulos rojos tienen una forma anormal de "hoz". ? Enfermedad heptica.  Es obeso. Cules son los signos o sntomas? Los sntomas de esta afeccin pueden ser de leves a graves. Los sntomas pueden aparecer en el trmino de 2 a 14 das despus de haber estado expuesto al virus. Incluyen los siguientes:  Fiebre o escalofros.  Tos.  Dificultad para respirar.  Dolores de cabeza, dolores en el cuerpo o dolores musculares.  Secrecin o congestin nasal.  Dolor de garganta.  Nueva prdida del sentido del gusto o del olfato. Algunas personas tambin pueden tener problemas estomacales, como nuseas, vmitos o diarrea. Es posible que otras personas no tengan sntomas de COVID-19.   Cmo se diagnostica? Esta afeccin se puede diagnosticar en funcin de lo siguiente:  Sus signos y sntomas, especialmente si: ? Vive en una zona  donde hay un brote de COVID-19. ? Viaj recientemente a una zona donde el virus es frecuente. ? Cuida o vive con una persona a quien se le diagnostic COVID-19. ? Estuvo expuesto a una persona a la que se le diagnostic COVID-19.  Un examen fsico.  Anlisis de laboratorio que pueden incluir: ? Tomar una muestra de lquido de la parte posterior de la nariz y la garganta (lquido nasofarngeo), la nariz o la garganta, con un hisopo. ? Una muestra de mucosidad de los pulmones (esputo). ? Anlisis de sangre.  Los estudios de diagnstico por imgenes pueden incluir radiografas, exploracin por tomografa computarizada (TC) o ecografa. Cmo se trata? En este momento, no hay ningn medicamento para tratar el COVID-19. Los medicamentos para tratar otras enfermedades se usan a modo de ensayo para comprobar si son eficaces contra el COVID-19. El mdico le informar sobre las maneras de tratar los sntomas. En la mayora de las personas, la infeccin es leve y puede controlarse en el hogar con reposo, lquidos y medicamentos de venta libre. El tratamiento para una infeccin grave suele realizarse en la unidad de cuidados intensivos (UCI) de un hospital. Puede incluir uno o ms de los siguientes. Estos tratamientos se administran hasta que los sntomas mejoran.  Recibir lquidos y medicamentos a travs de una va intravenosa.  Oxgeno complementario. Para administrar oxgeno extra, se utiliza un tubo en la nariz, una mascarilla o una campana de oxgeno.  Colocarlo para que se recueste boca abajo (decbito prono). Esto facilita el ingreso de oxgeno a los pulmones.  Uso continuo de una mquina de presin positiva de las vas areas (CPAP) o de presin positiva de las vas areas de dos niveles (BPAP). Este tratamiento utiliza una presin de aire leve para mantener las vas respiratorias abiertas. Un tubo conectado a un motor administra oxgeno al cuerpo.  Respirador. Este tratamiento mueve el aire  dentro y fuera de los pulmones mediante el uso de un tubo que se coloca en la trquea.  Traqueostoma. En este procedimiento se hace un orificio en el cuello para insertar un tubo de respiracin.  Oxigenacin por membrana extracorprea (OMEC). En este procedimiento, los pulmones tienen la posibilidad de recuperarse al asumir las funciones del corazn y los pulmones. Suministra oxgeno al cuerpo y elimina el dixido de carbono. Siga estas instrucciones en su casa: Estilo de vida  Si est enfermo, qudese en su casa, excepto para obtener atencin mdica. El mdico le indicar cunto tiempo debe quedarse en casa. Llame al mdico antes de buscar atencin mdica.  Haga reposo en su casa como se lo haya indicado el mdico.  No consuma ningn producto que contenga nicotina o tabaco, como cigarrillos, cigarrillos electrnicos y tabaco de mascar. Si necesita ayuda para dejar de fumar, consulte al mdico.  Retome sus actividades normales segn lo indicado por el mdico. Pregntele al mdico qu actividades son seguras para usted. Instrucciones generales  Use los medicamentos de venta libre y los recetados solamente como se lo haya indicado el mdico.  Beba suficiente lquido como para mantener la orina de color amarillo plido.  Concurra a todas las visitas de seguimiento como se lo haya indicado el mdico. Esto es importante. Cmo se evita?  No hay ninguna vacuna que ayude a prevenir la infeccin por COVID-19. Sin embargo, hay medidas que puede tomar para protegerse y proteger a   otras personas de este virus. Para protegerse:   No viaje a zonas donde el COVID-19 sea un riesgo. Las zonas donde se informa la presencia del COVID-19 cambian con frecuencia. Para identificar las zonas de alto riesgo y las restricciones de viaje, consulte el sitio web de viajes de los Centers for Disease Control and Prevention (CDC) (Centros para el Control y la Prevencin de Enfermedades):  wwwnc.cdc.gov/travel/notices  Si vive o debe viajar a una zona donde el COVID-19 es un riesgo, tome precauciones para evitar infecciones. ? Aljese de las personas enfermas. ? Lvese las manos frecuentemente con agua y jabn durante 20segundos. Use desinfectante para manos con alcohol si no dispone de agua y jabn. ? Evite tocarse la boca, la cara, los ojos o la nariz. ? Evite salir de su casa, siga las indicaciones de su estado y de las autoridades sanitarias locales. ? Si debe salir de su casa, use un barbijo de tela o una mascarilla facial. Asegrese de que le cubra la nariz y la boca. ? Evite los espacios interiores repletos de gente. Mantenga una distancia de al menos 6 pies (2 metros) de otras personas. ? Desinfecte los objetos y las superficies que se tocan con frecuencia todos los das. Pueden incluir:  Encimeras y mesas.  Picaportes e interruptores de luz.  Lavabos, fregaderos y grifos.  Aparatos electrnicos tales como telfonos, controles remotos, teclados, computadoras y tabletas. Cmo proteger a los dems: Si tiene sntomas de COVID-19, tome medidas para evitar que el virus se propague a otras personas.  Si cree que tiene una infeccin por COVID-19, comunquese de inmediato con su mdico. Informe al equipo de atencin mdica que cree que puede tener una infeccin por el COVID-19.  Qudese en su casa. Salga de su casa solo para buscar atencin mdica. No utilice el transporte pblico.  No viaje mientras est enfermo.  Lvese las manos frecuentemente con agua y jabn durante 20segundos. Usar desinfectante para manos con alcohol si no dispone de agua y jabn.  Mantngase alejado de quienes vivan con usted. Permita que los miembros de la familia sanos cuiden a los nios y las mascotas, si es posible. Si tiene que cuidar a los nios o las mascotas, lvese las manos con frecuencia y use un barbijo. Si es posible, permanezca en su habitacin, separado de los dems. Utilice un  bao diferente.  Asegrese de que todas las personas que viven en su casa se laven bien las manos y con frecuencia.  Tosa o estornude en un pauelo de papel o sobre su manga o codo. No tosa o estornude al aire ni se cubra la boca o la nariz con la mano.  Use un barbijo de tela o una mascarilla facial. Asegrese de que le cubra la nariz y la boca. Dnde buscar ms informacin  Centers for Disease Control and Prevention (Centros para el Control y la Prevencin de Enfermedades): www.cdc.gov/coronavirus/2019-ncov/index.html  World Health Organization (Organizacin Mundial de la Salud): www.who.int/health-topics/coronavirus Comunquese con un mdico si:  Vive o ha viajado a una zona donde el COVID-19 es un riesgo y tiene sntomas de infeccin.  Ha tenido contacto con alguien que tiene COVID-19 y usted tiene sntomas de infeccin. Solicite ayuda inmediatamente si:  Tiene dificultad para respirar.  Siente dolor u opresin en el pecho.  Experimenta confusin.  Tiene las uas de los dedos y los labios de color azulado.  Tiene dificultad para despertarse.  Los sntomas empeoran. Estos sntomas pueden representar un problema grave que constituye una emergencia. No espere   a ver si los sntomas desaparecen. Solicite atencin mdica de inmediato. Comunquese con el servicio de emergencias de su localidad (911 en los [place]). No conduzca por sus propios medios hasta el hospital. Informe al personal mdico de emergencias si cree que tiene COVID-19. Resumen  El COVID-19 es una infeccin respiratoria causada por un virus. Tambin se conoce como enfermedad por coronavirus o nuevo coronavirus. Puede causar infecciones graves, como neumona, sndrome de dificultad respiratoria aguda, insuficiencia respiratoria aguda o sepsis.  El virus que causa el COVID-19 es contagioso. Esto significa que puede transmitirse de una persona a otra a travs de las gotitas que se despiden al respirar, hablar,  cantar, toser y estornudar.  Es ms probable que desarrolle una enfermedad grave si tiene 50 aos o ms, tiene el sistema inmunitario dbil, vive en un hogar de ancianos o tiene una enfermedad crnica.  No hay ningn medicamento para tratar el COVID-19. El mdico le informar sobre las maneras de tratar los sntomas.  Tome medidas para protegerse y proteger a los dems contra las infecciones. Lvese las manos con frecuencia y desinfecte los objetos y las superficies que se tocan con frecuencia todos los das. Mantngase alejado de las personas que estn enfermas y use un barbijo si est enfermo. Esta informacin no tiene como fin reemplazar el consejo del mdico. Asegrese de hacerle al mdico cualquier pregunta que tenga. Document Revised: 12/18/2018 Document Reviewed: 04/12/2018 Elsevier Patient Education  2020 Elsevier Inc. What types of side effects do monoclonal antibody drugs cause?  Common side effects  In general, the more common side effects caused by monoclonal antibody drugs include: . Allergic reactions, such as hives or itching . Flu-like signs and symptoms, including chills, fatigue, fever, and muscle aches and pains . Nausea, vomiting . Diarrhea . Skin rashes . Low blood pressure   The CDC is recommending patients who receive monoclonal antibody treatments wait at least 90 days before being vaccinated.  Currently, there are no data on the safety and efficacy of mRNA COVID-19 vaccines in persons who received monoclonal antibodies or convalescent plasma as part of COVID-19 treatment. Based on the estimated half-life of such therapies as well as evidence suggesting that reinfection is uncommon in the 90 days after initial infection, vaccination should be deferred for at least 90 days, as a precautionary measure until additional information becomes available, to avoid interference of the antibody treatment with vaccine-induced immune responses.  

## 2019-11-14 NOTE — Progress Notes (Signed)
  Diagnosis: COVID-19  Physician: Patrick Wright, MD  Procedure: [facility] Med: casirivimab\imdevimab infusion - Provided patient with casirivimab\imdevimab fact sheet for patients, parents and caregivers prior to infusion.  Complications: No immediate complications noted.  Discharge: Discharged home   Nerida Boivin N Ashlen Kiger 11/14/2019  

## 2019-12-19 ENCOUNTER — Encounter (HOSPITAL_COMMUNITY): Payer: Self-pay

## 2019-12-19 ENCOUNTER — Emergency Department (HOSPITAL_COMMUNITY)
Admission: EM | Admit: 2019-12-19 | Discharge: 2019-12-19 | Disposition: A | Discharge status: Home / Self Care | Payer: Self-pay | Attending: Emergency Medicine | Admitting: Emergency Medicine

## 2019-12-19 ENCOUNTER — Other Ambulatory Visit: Payer: Self-pay

## 2019-12-19 ENCOUNTER — Emergency Department (HOSPITAL_COMMUNITY): Payer: Self-pay

## 2019-12-19 DIAGNOSIS — W3400XA Accidental discharge from unspecified firearms or gun, initial encounter: Secondary | ICD-10-CM | POA: Insufficient documentation

## 2019-12-19 DIAGNOSIS — J45909 Unspecified asthma, uncomplicated: Secondary | ICD-10-CM | POA: Insufficient documentation

## 2019-12-19 DIAGNOSIS — Y9384 Activity, sleeping: Secondary | ICD-10-CM | POA: Insufficient documentation

## 2019-12-19 DIAGNOSIS — S91301A Unspecified open wound, right foot, initial encounter: Secondary | ICD-10-CM | POA: Insufficient documentation

## 2019-12-19 DIAGNOSIS — Y92009 Unspecified place in unspecified non-institutional (private) residence as the place of occurrence of the external cause: Secondary | ICD-10-CM | POA: Insufficient documentation

## 2019-12-19 MED ORDER — HYDROCODONE-ACETAMINOPHEN 5-325 MG PO TABS
1.0000 | ORAL_TABLET | Freq: Once | ORAL | Status: AC
  Administered 2019-12-19: 1 via ORAL
  Filled 2019-12-19: qty 1

## 2019-12-19 MED ORDER — CLINDAMYCIN HCL 150 MG PO CAPS: 150.0000 mg | ORAL_CAPSULE | Freq: Three times a day (TID) | ORAL | 0 refills | Status: AC

## 2019-12-19 NOTE — Progress Notes (Signed)
Orthopedic Tech Progress Note Patient Details:  Christina Vance 26-Jan-1971 [id number]  Ortho Devices Type of Ortho Device: CAM walker, Crutches Ortho Device/Splint Location: Right Foot Ortho Device/Splint Interventions: Application, Adjustment   Post Interventions Patient Tolerated: Well Instructions Provided: Adjustment of device, Poper ambulation with device   Randell Teare E [name] 12/19/2019, 4:23 AM
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
## 2019-12-19 NOTE — Discharge Instructions (Addendum)
For pain control you may take at 1000 mg of Tylenol every 8 hours scheduled.   I am also prescribing an antibiotic to prevent infection.  Para controlar el dolor, puede tomar 1000 mg de Tylenol cada 8 horas programadas. Tambin le estoy prescribiendo un antibitico para prevenir infecciones.
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
## 2019-12-21 ENCOUNTER — Other Ambulatory Visit: Payer: Self-pay

## 2019-12-21 ENCOUNTER — Ambulatory Visit (INDEPENDENT_AMBULATORY_CARE_PROVIDER_SITE_OTHER): Payer: Self-pay | Admitting: Podiatry

## 2019-12-21 DIAGNOSIS — S90851A Superficial foreign body, right foot, initial encounter: Secondary | ICD-10-CM

## 2019-12-21 DIAGNOSIS — W3400XA Accidental discharge from unspecified firearms or gun, initial encounter: Secondary | ICD-10-CM

## 2019-12-21 NOTE — Patient Instructions (Signed)
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
## 2019-12-28 ENCOUNTER — Encounter: Payer: Self-pay | Admitting: Podiatry

## 2020-01-07 ENCOUNTER — Encounter: Payer: Self-pay | Admitting: Podiatry

## 2020-01-25 ENCOUNTER — Encounter: Payer: Self-pay | Admitting: Podiatry

## 2020-06-15 ENCOUNTER — Encounter (HOSPITAL_COMMUNITY): Payer: Self-pay | Admitting: Emergency Medicine

## 2020-06-15 ENCOUNTER — Emergency Department (HOSPITAL_COMMUNITY): Payer: 59

## 2020-06-15 ENCOUNTER — Other Ambulatory Visit: Payer: Self-pay

## 2020-06-15 ENCOUNTER — Emergency Department (HOSPITAL_COMMUNITY)
Admission: EM | Admit: 2020-06-15 | Discharge: 2020-06-15 | Disposition: A | Discharge status: Home / Self Care | Payer: 59 | Attending: Emergency Medicine | Admitting: Emergency Medicine

## 2020-06-15 DIAGNOSIS — R0602 Shortness of breath: Secondary | ICD-10-CM | POA: Diagnosis not present

## 2020-06-15 DIAGNOSIS — R079 Chest pain, unspecified: Secondary | ICD-10-CM | POA: Insufficient documentation

## 2020-06-15 DIAGNOSIS — J45909 Unspecified asthma, uncomplicated: Secondary | ICD-10-CM | POA: Diagnosis not present

## 2020-06-15 DIAGNOSIS — R2232 Localized swelling, mass and lump, left upper limb: Secondary | ICD-10-CM | POA: Diagnosis not present

## 2020-06-15 DIAGNOSIS — R42 Dizziness and giddiness: Secondary | ICD-10-CM | POA: Insufficient documentation

## 2020-06-15 DIAGNOSIS — R519 Headache, unspecified: Secondary | ICD-10-CM | POA: Diagnosis not present

## 2020-06-15 LAB — COMPREHENSIVE METABOLIC PANEL
ALT: 45 U/L — ABNORMAL HIGH (ref 0–44)
AST: 41 U/L (ref 15–41)
Albumin: 3.8 g/dL (ref 3.5–5.0)
Alkaline Phosphatase: 120 U/L (ref 38–126)
Anion gap: 7 (ref 5–15)
BUN: 11 mg/dL (ref 6–20)
CO2: 27 mmol/L (ref 22–32)
Calcium: 9.1 mg/dL (ref 8.9–10.3)
Chloride: 105 mmol/L (ref 98–111)
Creatinine, Ser: 0.61 mg/dL (ref 0.44–1.00)
GFR, Estimated: >60 mL/min (ref >60)
Glucose, Bld: 127 mg/dL — ABNORMAL HIGH (ref 70–99)
Potassium: 3.6 mmol/L (ref 3.5–5.1)
Sodium: 139 mmol/L (ref 135–145)
Total Bilirubin: 0.4 mg/dL (ref 0.3–1.2)
Total Protein: 7.3 g/dL (ref 6.5–8.1)

## 2020-06-15 LAB — CBC WITH DIFFERENTIAL/PLATELET
Abs Immature Granulocytes: 0.03 10*3/uL (ref 0.00–0.07)
Basophils Absolute: 0 10*3/uL (ref 0.0–0.1)
Basophils Relative: 1 %
Eosinophils Absolute: 0.2 10*3/uL (ref 0.0–0.5)
Eosinophils Relative: 3 %
HCT: 36.9 % (ref 36.0–46.0)
Hemoglobin: 11.5 g/dL — ABNORMAL LOW (ref 12.0–15.0)
Immature Granulocytes: 1 %
Lymphocytes Relative: 39 %
Lymphs Abs: 2.5 10*3/uL (ref 0.7–4.0)
MCH: 25 pg — ABNORMAL LOW (ref 26.0–34.0)
MCHC: 31.2 g/dL (ref 30.0–36.0)
MCV: 80.2 fL (ref 80.0–100.0)
Monocytes Absolute: 0.5 10*3/uL (ref 0.1–1.0)
Monocytes Relative: 8 %
Neutro Abs: 3.1 10*3/uL (ref 1.7–7.7)
Neutrophils Relative %: 48 %
Platelets: 259 10*3/uL (ref 150–400)
RBC: 4.6 MIL/uL (ref 3.87–5.11)
RDW: 17.6 % — ABNORMAL HIGH (ref 11.5–15.5)
WBC: 6.4 10*3/uL (ref 4.0–10.5)
nRBC: 0 % (ref 0.0–0.2)

## 2020-06-15 LAB — I-STAT BETA HCG BLOOD, ED (MC, WL, AP ONLY): I-stat hCG, quantitative: <5 m[IU]/mL (ref <5)

## 2020-06-15 LAB — TROPONIN I (HIGH SENSITIVITY)
Troponin I (High Sensitivity): 3 ng/L (ref <18)
Troponin I (High Sensitivity): 2 ng/L (ref <18)

## 2020-06-15 LAB — LIPASE, BLOOD: Lipase: 40 U/L (ref 11–51)

## 2020-06-15 NOTE — ED Provider Notes (Signed)
[facility] EMERGENCY DEPARTMENT Provider Note   CSN: [id number] Arrival date & time: 06/15/20  1445     History No chief complaint on file.   Christina Vance is a 50 y.o. female.  She is here with a complaint of left-sided chest pain associated with some left arm pain and hand swelling been going on for a week.  Seems to be worse with eating and also worse with movement.  She works as a Public librarian.  No trauma.  Sometimes has some shortness of breath and some dizziness and headache when the pain comes on.  Currently having minimal symptoms.  She said she saw her PCP who wanted to come here to make sure her heart was okay.  Also has been trouble some gallbladder pain.  Non-smoker no prior cardiac history  The history is provided by the patient. The history is limited by a language barrier. A language interpreter was used (ipad - spanish).  Chest Pain Pain location:  L chest Pain quality: pressure   Pain radiates to:  L arm Pain severity:  Moderate Onset quality:  Gradual Duration:  1 week Timing:  Constant Progression:  Improving Context: eating and movement   Relieved by:  None tried Worsened by:  Movement Ineffective treatments:  None tried Associated symptoms: abdominal pain, dizziness and shortness of breath   Associated symptoms: no cough, no diaphoresis, no fever, no nausea and no vomiting   Risk factors: no smoking        Past Medical History:  Diagnosis Date  . Asthma   . Gastritis     There are no problems to display for this patient.   Past Surgical History:  Procedure Laterality Date  . CESAREAN SECTION       OB History   No obstetric history on file.     Family History  Problem Relation Age of Onset  . Diabetes Mother     Social History   Tobacco Use  . Smoking status: Never Smoker  . Smokeless tobacco: Never Used  Vaping Use  . Vaping Use: Never used  Substance Use Topics  . Alcohol use: No  . Drug use: Never     Home Medications Prior to Admission medications   Medication Sig Start Date End Date Taking? Authorizing Provider  acetaminophen (TYLENOL) 500 MG tablet Take 500 mg by mouth daily as needed for headache.   Yes [provider]  albuterol (VENTOLIN HFA) 108 (90 Base) MCG/ACT inhaler Inhale 2 puffs into the lungs every 6 (six) hours as needed for wheezing or shortness of breath. 11/12/19  Yes Trifan, Kermit Balo, MD  Cholecalciferol (VITAMIN D3) 1.25 MG (50000 UT) CAPS Take 50,000 Units by mouth once a week. Fridays 05/10/20  Yes [provider]  omeprazole (PRILOSEC) 20 MG capsule Take 1 capsule (20 mg total) by mouth daily. 05/08/13  Yes Upstill, Shari, PA-C  ondansetron (ZOFRAN ODT) 4 MG disintegrating tablet Take 1 tablet (4 mg total) by mouth every 8 (eight) hours as needed for up to 10 doses for nausea or vomiting. Patient not taking: Reported on 06/15/2020 11/12/19   Terald Sleeper, MD  diphenhydrAMINE (BENADRYL) 25 MG tablet Take 1 tablet (25 mg total) by mouth every 6 (six) hours as needed for itching (Rash). Patient not taking: Reported on 08/27/2017 05/15/15 04/20/19  Roxy Horseman, PA-C    Allergies    Ibuprofen, Other, Penicillins, and Shrimp flavor  Review of Systems   Review of Systems  Constitutional: Negative  for diaphoresis and fever.  HENT: Negative for sore throat.   Eyes: Negative for visual disturbance.  Respiratory: Positive for shortness of breath. Negative for cough.   Cardiovascular: Positive for chest pain.  Gastrointestinal: Positive for abdominal pain. Negative for nausea and vomiting.  Genitourinary: Negative for dysuria.  Musculoskeletal: Negative for neck pain.  Skin: Negative for rash.  Neurological: Positive for dizziness.    Physical Exam Updated Vital Signs BP 127/83   Pulse 82   Temp 98.6 F (37 C)   Resp 16   SpO2 99%   Physical Exam Vitals and nursing note reviewed.  Constitutional:      General: She is not in acute  distress.    Appearance: Normal appearance. She is well-developed.  HENT:     Head: Normocephalic and atraumatic.  Eyes:     Conjunctiva/sclera: Conjunctivae normal.  Cardiovascular:     Rate and Rhythm: Normal rate and regular rhythm.     Heart sounds: No murmur heard.   Pulmonary:     Effort: Pulmonary effort is normal. No respiratory distress.     Breath sounds: Normal breath sounds.  Abdominal:     Palpations: Abdomen is soft.     Tenderness: There is no abdominal tenderness. There is no guarding or rebound.  Musculoskeletal:        General: No deformity. Normal range of motion.     Cervical back: Neck supple.     Right lower leg: No edema.     Left lower leg: No edema.  Skin:    General: Skin is warm and dry.     Capillary Refill: Capillary refill takes less than 2 seconds.  Neurological:     General: No focal deficit present.     Mental Status: She is alert.     ED Results / Procedures / Treatments   Labs (all labs ordered are listed, but only abnormal results are displayed) Labs Reviewed  CBC WITH DIFFERENTIAL/PLATELET - Abnormal; Notable for the following components:      Result Value   Hemoglobin 11.5 (*)    MCH 25.0 (*)    RDW 17.6 (*)    All other components within normal limits  COMPREHENSIVE METABOLIC PANEL - Abnormal; Notable for the following components:   Glucose, Bld 127 (*)    ALT 45 (*)    All other components within normal limits  LIPASE, BLOOD  I-STAT BETA HCG BLOOD, ED ([facility], [facility], [facility] ONLY)  TROPONIN I (HIGH SENSITIVITY)  TROPONIN I (HIGH SENSITIVITY)    EKG EKG Interpretation  Date/Time:  Wednesday June 15 2020 15:03:32 EDT Ventricular Rate:  76 PR Interval:  136 QRS Duration: 78 QT Interval:  354 QTC Calculation: 398 R Axis:   31 Text Interpretation: Normal sinus rhythm Normal ECG No significant change since prior 7/21 Confirmed by Meridee Score [id number]) on 06/15/2020 3:05:10 PM   Radiology DG Chest 2 View  Result Date:  06/15/2020 CLINICAL DATA:  Chest pain and shortness of breath for 5 days EXAM: CHEST - 2 VIEW COMPARISON:  05/08/2013 FINDINGS: Unchanged cardiomediastinal silhouette. There is no new focal airspace disease. No pleural effusion or pneumothorax. There is no acute osseous abnormality. IMPRESSION: No evidence of acute cardiopulmonary disease. Electronically Signed   By: Caprice Renshaw   On: 06/15/2020 16:04    Procedures Procedures   Medications Ordered in ED Medications - No data to display  ED Course  I have reviewed the triage vital signs and the nursing notes.  Pertinent labs &  imaging results that were available during my care of the patient were reviewed by me and considered in my medical decision making (see chart for details).  Clinical Course as of 06/16/20 3662  Wed Jun 15, 2020  1603 X-ray interpreted by me as no acute infiltrates.  Awaiting radiology reading. [MB]  1805 She is PERC negative [MB]  2006 Reviewed work-up with patient and discharge plan via Spanish interpreter iPad.  Patient is comfortable plan.  She has an outpatient follow-up with GI because she been having a lot of stomach issues. [MB]    Clinical Course User Index [MB] Terrilee Files, MD   MDM Rules/Calculators/A&P                         This patient complains of chest pain; this involves an extensive number of treatment Options and is a complaint that carries with it a high risk of complications and Morbidity. The differential includes ACS, pneumonia, PE, musculoskeletal, GERD, biliary colic, gastritis  I ordered, reviewed and interpreted labs, which included CBC with normal white count, stable hemoglobin, chemistries normal, LFTs fairly normal, troponins flat I ordered imaging studies which included chest x-ray and I independently    visualized and interpreted imaging which showed no acute findings Previous records obtained and reviewed in epic, no recent visits  After the interventions stated above, I  reevaluated the patient and found patient to be hemodynamically stable and fairly asymptomatic.  Reviewed work-up with her via iPad interpreter.  She is comfortable plan for outpatient follow-up with her PCP and her GI doctors.  Return instructions discussed   Final Clinical Impression(s) / ED Diagnoses Final diagnoses:  Nonspecific chest pain  Localized swelling on left hand    Rx / DC Orders ED Discharge Orders    None       Terrilee Files, MD 06/16/20 0930

## 2020-06-15 NOTE — ED Notes (Signed)
Two unsuccessful blood draw attempts  

## 2020-06-15 NOTE — Discharge Instructions (Addendum)
Please follow-up with your primary care doctor for further evaluation of your symptoms return to the emergency department if any worsening or concerning symptoms

## 2020-06-15 NOTE — ED Triage Notes (Signed)
Emergency Medicine Provider Triage Evaluation Note  Christina Vance , a 50 y.o. female  was evaluated in triage.  Pt complains of chest pain and epigastric pain.  Symptoms started about a week ago.  Pain is in her left upper chest and in the epigastric region of her abdomen.  Worse with eating.  No vomiting but she feels nauseous when she eats a lot.  Review of Systems  Positive: Chest pain, abdominal pain, nausea Negative: Fever, syncope, vomiting  Physical Exam  BP 127/83   Pulse 82   Temp 98.6 F (37 C)   Resp 16   SpO2 99%  Gen:   Awake, no distress   HEENT:  Atraumatic  Resp:  Normal effort  Cardiac:  Normal rate  Abd:   Nondistended, mild epigastric tenderness, no focal right upper quadrant tenderness MSK:   Moves extremities without difficulty  Neuro:  Speech clear   Medical Decision Making  Medically screening exam initiated at 2:52 PM.  Appropriate orders placed.  Christina Vance was informed that the remainder of the evaluation will be completed by another provider, this initial triage assessment does not replace that evaluation, and the importance of remaining in the ED until their evaluation is complete.  Clinical Impression  1.  Chest pain 2.  Abdominal pain   Dartha Lodge, [place] 06/15/20 1459

## 2020-06-15 NOTE — ED Notes (Signed)
Translator used for discharge. Medications follow up appts reviewed w/ pt. Denies questions or concerns @ this time. Education on s/s of worsening and when to return. Evaluated for CP workup WNL. Denies CP @ this time. . Left w/ even and steady gait. NAD noted.  VSS.

## 2020-06-15 NOTE — ED Triage Notes (Signed)
Pt here from home with c/o chest pain and abd pain along with hand ans feet swelling,

## 2020-06-21 ENCOUNTER — Encounter: Payer: Self-pay | Admitting: Physician Assistant

## 2020-06-30 ENCOUNTER — Encounter: Payer: Self-pay | Admitting: Physician Assistant

## 2020-06-30 ENCOUNTER — Other Ambulatory Visit: Payer: Self-pay

## 2020-06-30 ENCOUNTER — Ambulatory Visit (INDEPENDENT_AMBULATORY_CARE_PROVIDER_SITE_OTHER): Payer: 59 | Admitting: Physician Assistant

## 2020-06-30 VITALS — BP 110/84 | HR 101 | Ht 62.0 in | Wt 191.0 lb

## 2020-06-30 DIAGNOSIS — K219 Gastro-esophageal reflux disease without esophagitis: Secondary | ICD-10-CM

## 2020-06-30 DIAGNOSIS — R6881 Early satiety: Secondary | ICD-10-CM | POA: Diagnosis not present

## 2020-06-30 DIAGNOSIS — R1013 Epigastric pain: Secondary | ICD-10-CM | POA: Diagnosis not present

## 2020-06-30 DIAGNOSIS — R110 Nausea: Secondary | ICD-10-CM | POA: Diagnosis not present

## 2020-06-30 MED ORDER — OMEPRAZOLE 20 MG PO CPDR: 20.0000 mg | DELAYED_RELEASE_CAPSULE | Freq: Two times a day (BID) | ORAL | 5 refills | Status: DC

## 2020-06-30 NOTE — Patient Instructions (Signed)
If you are age 49 or older, your body mass index should be between 23-30. Your Body mass index is 34.93 kg/m. If this is out of the aforementioned range listed, please consider follow up with your Primary Care Provider.  If you are age 38 or younger, your body mass index should be between 19-25. Your Body mass index is 34.93 kg/m. If this is out of the aformentioned range listed, please consider follow up with your Primary Care Provider.   You have been scheduled for an endoscopy and colonoscopy. Please follow the written instructions given to you at your visit today. Please pick up your prep supplies at the pharmacy within the next 1-3 days. If you use inhalers (even only as needed), please bring them with you on the day of your procedure.  Due to recent changes in healthcare laws, you may see the results of your imaging and laboratory studies on MyChart before your provider has had a chance to review them.  We understand that in some cases there may be results that are confusing or concerning to you. Not all laboratory results come back in the same time frame and the provider may be waiting for multiple results in order to interpret others.  Please give [place] 48 hours in order for your provider to thoroughly review all the results before contacting the office for clarification of your results.   Thank you for choosing me and [facility].  Hyacinth Meeker, PA-C

## 2020-06-30 NOTE — Progress Notes (Signed)
Chief Complaint: GERD  HPI:    Mrs. Christina Vance is a 50 year old Hispanic speaking female, who was referred to me by Eber Jones, NP for a complaint of GERD.      05/09/2020 patient seen by PCP and at that time patient was establishing care and wanted to address reflux symptoms that she had been having for 2 months.  At that time described that when she ate acidic foods or dairy products it would cause worsening symptoms.  She had tried omeprazole and other over-the-counter antacids but nothing helped.  But also tried dairy free products and that did help but it took a while to notice a difference.  Describes some dizziness after eating, headache, nausea, lightheadedness and midsternal chest pain that radiated to her arm.  Typically she would go rest and symptoms resolved.  Labs that day with a iron studies showing a decreased percent saturation at 6%, ferritin low at 6 and iron low at 33.  Folate normal.  Vitamin D low at 13.  CMP with normal LFTs.  Lipase normal.  TSH normal.  CBC normal.    06/15/2020 patient seen in the ER for left-sided chest pain with some left arm pain and hand swelling which been going on for a week.  Seem to be worse with eating and worse with movement.  CBC with hemoglobin 11.5 and otherwise normal.  CMP with an elevated glucose at 127 ALT of 45.  Normal lipase.  Negative hCG, normal troponins.  EKG and chest x-ray normal.    Today, patient presents to clinic accompanied by an interpreter, she explains that for the past 6 to 7 years she has had trouble with reflux.  Apparently was in the [place] 2 separate [place] the [place] related to this.  Initially was on Ranitidine which was changed to Omeprazole 20 mg over the past few months which "helps a little but not a lot".  Symptoms seem to increase as the day goes on and she just feels "full up", whenever she eats she tends to get a burning sensation in her epigastrium as well as nausea and  feels abdominal distention.  This is worse with greasy foods and dairy products.    Denies fever, chills, change in bowel habits, blood in her stool, weight loss or symptoms that awaken her from sleep.  Past Medical History:  Diagnosis Date  . Asthma   . Gastritis     Past Surgical History:  Procedure Laterality Date  . CESAREAN SECTION      Current Outpatient Medications  Medication Sig Dispense Refill  . acetaminophen (TYLENOL) 500 MG tablet Take 500 mg by mouth daily as needed for headache.    . albuterol (VENTOLIN HFA) 108 (90 Base) MCG/ACT inhaler Inhale 2 puffs into the lungs every 6 (six) hours as needed for wheezing or shortness of breath. 8 g 3  . Cholecalciferol (VITAMIN D3) 1.25 MG (50000 UT) CAPS Take 50,000 Units by mouth once a week. Fridays    . omeprazole (PRILOSEC) 20 MG capsule Take 1 capsule (20 mg total) by mouth daily. 14 capsule 0  . ondansetron (ZOFRAN ODT) 4 MG disintegrating tablet Take 1 tablet (4 mg total) by mouth every 8 (eight) hours as needed for up to 10 doses for nausea or vomiting. (Patient not taking: Reported on 06/15/2020) 10 tablet 0   No current facility-administered medications for this visit.    Allergies as of 06/30/2020 - Review Complete 06/15/2020  Allergen Reaction Noted  . Ibuprofen Swelling 05/07/2013  . Other Other (See Comments) 11/12/2019  . Penicillins Swelling 05/07/2013  . Shrimp flavor Hives and Itching 06/15/2020    Family History  Problem Relation Age of Onset  . Diabetes Mother     Social History   Socioeconomic History  . Marital status: Divorced    Spouse name: Not on file  . Number of children: Not on file  . Years of education: Not on file  . Highest education level: Not on file  Occupational History  . Not on file  Tobacco Use  . Smoking status: Never Smoker  . Smokeless tobacco: Never Used  Vaping Use  . Vaping Use: Never used  Substance and Sexual Activity  . Alcohol use: No  . Drug use: Never  .  Sexual activity: Yes  Other Topics Concern  . Not on file  Social History Narrative  . Not on file   Social Determinants of Health   Financial Resource Strain: Not on file  Food Insecurity: Not on file  Transportation Needs: Not on file  Physical Activity: Not on file  Stress: Not on file  Social Connections: Not on file  Intimate Partner Violence: Not on file    Review of Systems:    Constitutional: No weight loss, fever or chills Skin: No rash Cardiovascular: No chest pain Respiratory: No SOB Gastrointestinal: See HPI and otherwise negative Genitourinary: No dysuria Neurological: No headache, dizziness or syncope Musculoskeletal: No new muscle or joint pain Hematologic: No bleeding  Psychiatric: No history of depression or anxiety   Physical Exam:  Vital signs: BP 110/84   Pulse (!) 101   Ht 5\' 2"  (1.575 m)   Wt 191 lb (86.6 kg)   BMI 34.93 kg/m   Constitutional:   Pleasant overweight, Hispanic female appears to be in NAD, Well developed, Well nourished, alert and cooperative Head:  Normocephalic and atraumatic. Eyes:   PEERL, EOMI. No icterus. Conjunctiva pink. Ears:  Normal auditory acuity. Neck:  Supple Throat: Oral cavity and pharynx without inflammation, swelling or lesion.  Respiratory: Respirations even and unlabored. Lungs clear to auscultation bilaterally.   No wheezes, crackles, or rhonchi.  Cardiovascular: Normal S1, S2. No MRG. Regular rate and rhythm. No peripheral edema, cyanosis or pallor.  Gastrointestinal:  Soft, nondistended, mild epigastric TTP. No rebound or guarding. Normal bowel sounds. No appreciable masses or hepatomegaly. Rectal:  Not performed.  Msk:  Symmetrical without gross deformities. Without edema, no deformity or joint abnormality.  Neurologic:  Alert and  oriented x4;  grossly normal neurologically.  Skin:   Dry and intact without significant lesions or rashes. Psychiatric:  Demonstrates good judgement and reason without abnormal  affect or behaviors.  RELEVANT LABS AND IMAGING: CBC    Component Value Date/Time   WBC 6.4 06/15/2020 1454   RBC 4.60 06/15/2020 1454   HGB 11.5 (L) 06/15/2020 1454   HCT 36.9 06/15/2020 1454   PLT 259 06/15/2020 1454   MCV 80.2 06/15/2020 1454   MCH 25.0 (L) 06/15/2020 1454   MCHC 31.2 06/15/2020 1454   RDW 17.6 (H) 06/15/2020 1454   LYMPHSABS 2.5 06/15/2020 1454   MONOABS 0.5 06/15/2020 1454   EOSABS 0.2 06/15/2020 1454   BASOSABS 0.0 06/15/2020 1454    CMP     Component Value Date/Time   NA 139 06/15/2020 1459   K 3.6 06/15/2020 1459   CL 105 06/15/2020 1459   CO2 27 06/15/2020 1459   GLUCOSE 127 (H) 06/15/2020  1459   BUN 11 06/15/2020 1459   CREATININE 0.61 06/15/2020 1459   CALCIUM 9.1 06/15/2020 1459   PROT 7.3 06/15/2020 1459   ALBUMIN 3.8 06/15/2020 1459   AST 41 06/15/2020 1459   ALT 45 (H) 06/15/2020 1459   ALKPHOS 120 06/15/2020 1459   BILITOT 0.4 06/15/2020 1459   GFRNONAA >60 06/15/2020 1459   GFRAA >60 09/18/2019 2241    Assessment: 1.  GERD: With all the symptoms below over the past 6 to 7 years, little relief with Omeprazole 20 mg daily; consider gastritis+/- PUD versus other 2.  Early satiety 3.  Nausea 4.  Epigastric pain  Plan: 1.  Increased Omeprazole to 20 mg twice daily, 30-60 minutes before breakfast and dinner.  Prescribed #60 with 5 refills. 2.  Reviewed antireflux diet and lifestyle modifications. 3.  Scheduled the patient for an EGD in the [facility] with Dr. Russella Dar as he had an opening within the month.  She will continue to follow with Dr. Russella Dar as her primary GI physician after time of procedure. 4.  Did discuss colonoscopy with the patient, she is due for screening purposes but would like to wait on this procedure until after time of EGD. 5.  Patient to follow in clinic per recommendations from Dr. Russella Dar after time of procedure.  Hyacinth Meeker, PA-C [facility] 06/30/2020, 3:24 PM  Cc: Eber Jones, NP

## 2020-07-01 NOTE — Progress Notes (Signed)
Reviewed and agree with management plan.  Beyounce Dickens T. Stellar Gensel, MD FACG [phone]  

## 2020-07-15 ENCOUNTER — Other Ambulatory Visit: Payer: Self-pay

## 2020-07-15 ENCOUNTER — Ambulatory Visit (AMBULATORY_SURGERY_CENTER): Payer: 59 | Admitting: Gastroenterology

## 2020-07-15 ENCOUNTER — Encounter: Payer: Self-pay | Admitting: Gastroenterology

## 2020-07-15 VITALS — BP 113/66 | HR 82 | Resp 13

## 2020-07-15 DIAGNOSIS — K2100 Gastro-esophageal reflux disease with esophagitis, without bleeding: Secondary | ICD-10-CM | POA: Diagnosis not present

## 2020-07-15 DIAGNOSIS — D509 Iron deficiency anemia, unspecified: Secondary | ICD-10-CM

## 2020-07-15 DIAGNOSIS — R6881 Early satiety: Secondary | ICD-10-CM

## 2020-07-15 DIAGNOSIS — R110 Nausea: Secondary | ICD-10-CM

## 2020-07-15 MED ORDER — SODIUM CHLORIDE 0.9 % IV SOLN: 500.0000 mL | Freq: Once | INTRAVENOUS | Status: DC

## 2020-07-15 MED ORDER — OMEPRAZOLE 40 MG PO CPDR
40.0000 mg | DELAYED_RELEASE_CAPSULE | Freq: Two times a day (BID) | ORAL | 12 refills | Status: DC
Start: 2020-07-15 — End: 2020-08-23

## 2020-07-15 NOTE — Patient Instructions (Addendum)
USTED TUVO UN PROCEDIMIENTO ENDOSCPICO HOY EN EL  ENDOSCOPY CENTER:   Lea el informe del procedimiento que se le entreg para cualquier pregunta especfica sobre lo que se Dentist.  Si el informe del examen no responde a sus preguntas, por favor llame a su gastroenterlogo para aclararlo.  Si usted solicit que no se le den [employer] de lo que se Clinical cytogeneticist en su procedimiento al [employer] va a cuidar, entonces el informe del procedimiento se ha incluido en un sobre sellado para que usted lo revise despus cuando le sea ms conveniente.   LO QUE PUEDE ESPERAR: Algunas sensaciones de hinchazn en el abdomen.  Puede tener ms gases de lo normal.  El caminar puede ayudarle a eliminar el aire que se le puso en el tracto gastrointestinal durante el procedimiento y reducir la hinchazn.  Si le hicieron una endoscopia inferior (como una colonoscopia o una sigmoidoscopia flexible), podra notar manchas de sangre en las heces fecales o en el papel higinico.  Si se someti a una preparacin intestinal para su procedimiento, es posible que no tenga una evacuacin intestinal normal durante [employer].   Tenga en cuenta:  Es posible que note un poco de irritacin y congestin en la nariz o algn drenaje.  Esto es debido al oxgeno [employer] durante su procedimiento.  No hay que preocuparse y esto debe desaparecer ms o Regulatory affairs officer.     Despus de la endoscopia superior (EGD)  Vmitos de Retail buyer o material como caf molido   Dolor en el pecho o dolor debajo de los omplatos que antes no tena   Dolor o dificultad persistente para tragar  Falta de aire que antes no tena   Fiebre de 100F o ms  Heces fecales negras y pegajosas   Para asuntos urgentes o de Associate Professor, puede comunicarse con un gastroenterlogo a cualquier hora llamando al [phone].  DIETA:  Recomendamos una comida pequea al principio, pero luego puede continuar con su dieta normal.  Tome muchos  lquidos, Tax adviser las bebidas alcohlicas durante 24 horas.    ACTIVIDAD:  Debe planear tomarse las cosas con calma por el resto del da y no debe CONDUCIR ni usar maquinaria pesada Patent examiner (debido a los medicamentos de sedacin utilizados durante el examen).     SEGUIMIENTO: Nuestro personal llamar al nmero que aparece en su historial al siguiente da hbil de su procedimiento para ver cmo se siente y para responder cualquier pregunta o inquietud que pueda tener con respecto a la informacin que se le dio despus del procedimiento. Si no podemos contactarle, le dejaremos un mensaje.  Sin embargo, si se siente bien y no tiene English as a second language teacher, no es necesario que nos devuelva la llamada.  Asumiremos que ha regresado a sus actividades diarias normales sin incidentes. Si se le tomaron algunas biopsias, le contactaremos por telfono o por carta en las prximas 3 semanas.  Si no ha sabido [employer] biopsias en el transcurso de 3 semanas, por favor llmenos al [phone].   FIRMAS/CONFIDENCIALIDAD: Usted y/o el acompaante que le cuide han firmado documentos que se ingresarn en su historial mdico electrnico.  Estas firmas atestiguan el hecho de que la informacin anterior     A prescription was sent to [address] in [place], [place] for OMEPRAZOLE 40 mg take 2 x daily.  Best to take on an empty stomach 20-30 minutes before food.   Continue other medications. A handout was  given on Acid reflux (GERD).

## 2020-07-15 NOTE — Progress Notes (Addendum)
Amil Amen - spanish interpreter.  Per pt she asked if pt needs to have colonoscopy.  She has never  Had a colonoscopy.  She reported having "black stools".  Celia Asked Dr. Russella Dar and he said yes, to go ahead and set up a colonoscopy for IDA.  Natatlie SeChrist, RN will set up an appointment.  Per Dr. Russella Dar, the referral colonoscopy for IDA.   No problems noted in the recovery room. maw

## 2020-07-15 NOTE — Progress Notes (Signed)
Medical history reviewed with no changes noted. VS assessed by N.C 

## 2020-07-15 NOTE — Op Note (Addendum)
[facility] Patient Name: Christina Vance Procedure Date: 07/15/2020 11:02 AM MRN: [id number] Endoscopist: Meryl Dare , MD Age: 50 Referring MD:  Date of Birth: 07/05/1970 Gender: Female Account #: [id number] Procedure:                Upper GI endoscopy Indications:              Gastroesophageal reflux disease, Early satiety,                            Nausea with vomiting, Iron deficiency anemia. Medicines:                Monitored Anesthesia Care Procedure:                Pre-Anesthesia Assessment:                           - Prior to the procedure, a History and Physical                            was performed, and patient medications and                            allergies were reviewed. The patient's tolerance of                            previous anesthesia was also reviewed. The risks                            and benefits of the procedure and the sedation                            options and risks were discussed with the patient.                            All questions were answered, and informed consent                            was obtained. Prior Anticoagulants: The patient has                            taken no previous anticoagulant or antiplatelet                            agents. ASA Grade Assessment: II - A patient with                            mild systemic disease. After reviewing the risks                            and benefits, the patient was deemed in                            satisfactory condition to undergo the procedure.  After obtaining informed consent, the endoscope was                            passed under direct vision. Throughout the                            procedure, the patient's blood pressure, pulse, and                            oxygen saturations were monitored continuously. The                            Endoscope was introduced through the mouth, and                             advanced to the second part of duodenum. The upper                            GI endoscopy was accomplished without difficulty.                            The patient tolerated the procedure well. Scope In: Scope Out: Findings:                 LA Grade B (one or more mucosal breaks greater than                            5 mm, not extending between the tops of two mucosal                            folds) esophagitis with no bleeding was found in                            the distal esophagus.                           The exam of the esophagus was otherwise normal.                           A medium-sized hiatal hernia was present.                           The exam of the stomach was otherwise normal.                           The duodenal bulb and second portion of the                            duodenum were normal. Complications:            No immediate complications. Estimated Blood Loss:     Estimated blood loss: none. Impression:               - LA Grade B reflux esophagitis with no bleeding.                           -  Medium-sized hiatal hernia.                           - Normal duodenal bulb and second portion of the                            duodenum.                           - No specimens collected. Recommendation:           - Patient has a contact number available for                            emergencies. The signs and symptoms of potential                            delayed complications were discussed with the                            patient. Return to normal activities tomorrow.                            Written discharge instructions were provided to the                            patient.                           - Resume previous diet.                           - Closely follow antirefux measures long term.                           - Continue present medications.                           - Increase omeprazole to 40 mg po bid, 1 year of                             refills.                           - Schedule colonoscopy for further evaluation of                            iron deficiency anemia. Meryl Dare, MD 07/15/2020 11:16:46 AM This report has been signed electronically.

## 2020-07-15 NOTE — Progress Notes (Signed)
Report to PACU, RN, vss, BBS= Clear.  

## 2020-07-19 ENCOUNTER — Telehealth: Payer: Self-pay

## 2020-07-19 NOTE — Telephone Encounter (Signed)
  Follow up Call-  Call back number 07/15/2020  Post procedure Call Back phone  # [phone]  Permission to leave phone message Yes  Some recent data might be hidden     Patient questions:  Do you have a fever, pain , or abdominal swelling? No. Pain Score  0 *  Have you tolerated food without any problems? Yes.    Have you been able to return to your normal activities? Yes.    Do you have any questions about your discharge instructions: Diet   No. Medications  No. Follow up visit  No.  Do you have questions or concerns about your Care? No.  Actions: * If pain score is 4 or above: No action needed, pain <4.  1. Have you developed a fever since your procedure? no  2.   Have you had an respiratory symptoms (SOB or cough) since your procedure? no  3.   Have you tested positive for COVID 19 since your procedure no  4.   Have you had any family members/close contacts diagnosed with the COVID 19 since your procedure?  no   If yes to any of these questions please route to Laverna Peace, RN and Karlton Lemon, RN

## 2020-08-23 ENCOUNTER — Other Ambulatory Visit: Payer: Self-pay

## 2020-08-23 ENCOUNTER — Ambulatory Visit (AMBULATORY_SURGERY_CENTER): Payer: 59

## 2020-08-23 VITALS — Ht 62.0 in | Wt 194.0 lb

## 2020-08-23 DIAGNOSIS — Z1211 Encounter for screening for malignant neoplasm of colon: Secondary | ICD-10-CM

## 2020-08-23 MED ORDER — NA SULFATE-K SULFATE-MG SULF 17.5-3.13-1.6 GM/177ML PO SOLN: 1.0000 | Freq: Once | ORAL | 0 refills | Status: AC

## 2020-08-23 NOTE — Progress Notes (Signed)
No egg or soy allergy known to patient  No issues with past sedation with any surgeries or procedures Patient denies ever being told they had issues or difficulty with intubation  No FH of Malignant Hyperthermia No diet pills per patient No home 02 use per patient  No blood thinners per patient  Pt denies issues with constipation  No A fib or A flutter  COVID 19 guidelines implemented in [facility] today with Pt and RN  NO PA's for preps discussed with pt in PV today  Discussed with pt there will be an out-of-pocket cost for prep and that varies from $0 to 70 dollars  Due to the COVID-19 pandemic we are asking patients to follow certain guidelines.  Pt aware of COVID protocols and [facility] guidelines   

## 2020-09-06 ENCOUNTER — Other Ambulatory Visit: Payer: Self-pay

## 2020-09-06 ENCOUNTER — Ambulatory Visit (AMBULATORY_SURGERY_CENTER): Payer: 59 | Admitting: Gastroenterology

## 2020-09-06 ENCOUNTER — Encounter: Payer: Self-pay | Admitting: Gastroenterology

## 2020-09-06 VITALS — BP 99/64 | HR 71 | Temp 98.4°F | Resp 17 | Ht 62.0 in | Wt 194.0 lb

## 2020-09-06 DIAGNOSIS — Z1211 Encounter for screening for malignant neoplasm of colon: Secondary | ICD-10-CM

## 2020-09-06 MED ORDER — SODIUM CHLORIDE 0.9 % IV SOLN: 500.0000 mL | Freq: Once | INTRAVENOUS | Status: DC

## 2020-09-06 NOTE — Op Note (Signed)
[facility] Patient Name: Christina Vance Procedure Date: 09/06/2020 2:13 PM MRN: [id number] Endoscopist: Meryl Dare , MD Age: 50 Referring MD:  Date of Birth: 10-23-1970 Gender: Female Account #: [id number] Procedure:                Colonoscopy Indications:              Screening for colorectal malignant neoplasm Medicines:                Monitored Anesthesia Care Procedure:                Pre-Anesthesia Assessment:                           - Prior to the procedure, a History and Physical                            was performed, and patient medications and                            allergies were reviewed. The patient's tolerance of                            previous anesthesia was also reviewed. The risks                            and benefits of the procedure and the sedation                            options and risks were discussed with the patient.                            All questions were answered, and informed consent                            was obtained. Prior Anticoagulants: The patient has                            taken no previous anticoagulant or antiplatelet                            agents. ASA Grade Assessment: II - A patient with                            mild systemic disease. After reviewing the risks                            and benefits, the patient was deemed in                            satisfactory condition to undergo the procedure.                           After obtaining informed consent, the colonoscope  was passed under direct vision. Throughout the                            procedure, the patient's blood pressure, pulse, and                            oxygen saturations were monitored continuously. The                            Colonoscope was introduced through the anus and                            advanced to the the cecum, identified by                            appendiceal  orifice and ileocecal valve. The                            ileocecal valve, appendiceal orifice, and rectum                            were photographed. The quality of the bowel                            preparation was excellent. The colonoscopy was                            performed without difficulty. The patient tolerated                            the procedure well. Scope In: 2:23:22 PM Scope Out: 2:39:40 PM Scope Withdrawal Time: 0 hours 13 minutes 13 seconds  Total Procedure Duration: 0 hours 16 minutes 18 seconds  Findings:                 The perianal and digital rectal examinations were                            normal.                           The entire examined colon appeared normal on direct                            and retroflexion views. Complications:            No immediate complications. Estimated blood loss:                            None. Estimated Blood Loss:     Estimated blood loss: none. Impression:               - The entire examined colon is normal on direct and                            retroflexion views.                           -  No specimens collected. Recommendation:           - Repeat colonoscopy in 10 years for screening                            purposes.                           - Patient has a contact number available for                            emergencies. The signs and symptoms of potential                            delayed complications were discussed with the                            patient. Return to normal activities tomorrow.                            Written discharge instructions were provided to the                            patient.                           - Resume previous diet.                           - Continue present medications. Meryl Dare, MD 09/06/2020 2:41:45 PM This report has been signed electronically.

## 2020-09-06 NOTE — Progress Notes (Signed)
Interpreter used today at the [facility] for this pt.  Interpreter's name is- Equities trader used today at the [employer] for this pt.  Interpreter's name is- Ambulance person

## 2020-09-06 NOTE — Progress Notes (Signed)
VS taken by [place] 

## 2020-09-06 NOTE — Progress Notes (Signed)
pt tolerated well. VSS. awake and to recovery. Report given to RN.  

## 2020-09-06 NOTE — Patient Instructions (Signed)
USTED TUVO UN PROCEDIMIENTO ENDOSCPICO HOY EN EL [facility]:   Lea el informe del procedimiento que se le entreg para cualquier pregunta especfica sobre lo que se encontr durante su examen.  Si el informe del examen no responde a sus preguntas, por favor llame a su gastroenterlogo para aclararlo.  Si usted solicit que no se le den detalles de lo que se encontr en su procedimiento al acompaante que le va a cuidar, entonces el informe del procedimiento se ha incluido en un sobre sellado para que usted lo revise despus cuando le sea ms conveniente.   LO QUE PUEDE ESPERAR: Algunas sensaciones de hinchazn en el abdomen.  Puede tener ms gases de lo normal.  El caminar puede ayudarle a eliminar el aire que se le puso en el tracto gastrointestinal durante el procedimiento y reducir la hinchazn.  Si le hicieron una endoscopia inferior (como una colonoscopia o una sigmoidoscopia flexible), podra notar manchas de sangre en las heces fecales o en el papel higinico.  Si se someti a una preparacin intestinal para su procedimiento, es posible que no tenga una evacuacin intestinal normal durante algunos das.   Tenga en cuenta:  Es posible que note un poco de irritacin y congestin en la nariz o algn drenaje.  Esto es debido al oxgeno utilizado durante su procedimiento.  No hay que preocuparse y esto debe desaparecer ms o menos en un da.   SNTOMAS PARA REPORTAR INMEDIATAMENTE:  Despus de una endoscopia inferior (colonoscopia o sigmoidoscopia flexible):  Cantidades excesivas de sangre en las heces fecales  Sensibilidad significativa o empeoramiento de los dolores abdominales   Hinchazn aguda del abdomen que antes no tena   Fiebre de 100F o ms    Para asuntos urgentes o de emergencia, puede comunicarse con un gastroenterlogo a cualquier hora llamando al [phone].  DIETA:  Recomendamos una comida pequea al principio, pero luego puede continuar con su dieta normal.   Tome muchos lquidos, pero debe evitar las bebidas alcohlicas durante 24 horas.    ACTIVIDAD:  Debe planear tomarse las cosas con calma por el resto del da y no debe CONDUCIR ni usar maquinaria pesada hasta maana (debido a los medicamentos de sedacin utilizados durante el examen).     SEGUIMIENTO: Nuestro personal llamar al nmero que aparece en su historial al siguiente da hbil de su procedimiento para ver cmo se siente y para responder cualquier pregunta o inquietud que pueda tener con respecto a la informacin que se le dio despus del procedimiento. Si no podemos contactarle, le dejaremos un mensaje.  Sin embargo, si se siente bien y no tiene ningn problema, no es necesario que nos devuelva la llamada.  Asumiremos que ha regresado a sus actividades diarias normales sin incidentes. Si se le tomaron algunas biopsias, le contactaremos por telfono o por carta en las prximas 3 semanas.  Si no ha sabido nada sobre las biopsias en el transcurso de 3 semanas, por favor llmenos al [phone].   FIRMAS/CONFIDENCIALIDAD: Usted y/o el acompaante que le cuide han firmado documentos que se ingresarn en su historial mdico electrnico.  Estas firmas atestiguan el hecho de que la informacin anterior  

## 2021-03-09 ENCOUNTER — Encounter: Payer: Self-pay | Admitting: General Practice

## 2021-04-07 ENCOUNTER — Other Ambulatory Visit: Payer: Self-pay

## 2021-04-07 ENCOUNTER — Emergency Department (HOSPITAL_COMMUNITY): Payer: 59

## 2021-04-07 ENCOUNTER — Encounter (HOSPITAL_COMMUNITY): Payer: Self-pay

## 2021-04-07 ENCOUNTER — Emergency Department (HOSPITAL_COMMUNITY)
Admission: EM | Admit: 2021-04-07 | Discharge: 2021-04-07 | Disposition: A | Discharge status: Home / Self Care | Payer: 59 | Attending: Emergency Medicine | Admitting: Emergency Medicine

## 2021-04-07 DIAGNOSIS — R531 Weakness: Secondary | ICD-10-CM | POA: Insufficient documentation

## 2021-04-07 DIAGNOSIS — R5383 Other fatigue: Secondary | ICD-10-CM | POA: Diagnosis not present

## 2021-04-07 DIAGNOSIS — R0789 Other chest pain: Secondary | ICD-10-CM | POA: Insufficient documentation

## 2021-04-07 DIAGNOSIS — R0602 Shortness of breath: Secondary | ICD-10-CM | POA: Diagnosis not present

## 2021-04-07 DIAGNOSIS — M7989 Other specified soft tissue disorders: Secondary | ICD-10-CM | POA: Diagnosis not present

## 2021-04-07 LAB — COMPREHENSIVE METABOLIC PANEL
ALT: 39 U/L (ref 0–44)
AST: 38 U/L (ref 15–41)
Albumin: 3.7 g/dL (ref 3.5–5.0)
Alkaline Phosphatase: 112 U/L (ref 38–126)
Anion gap: 9 (ref 5–15)
BUN: 11 mg/dL (ref 6–20)
CO2: 25 mmol/L (ref 22–32)
Calcium: 9.1 mg/dL (ref 8.9–10.3)
Chloride: 105 mmol/L (ref 98–111)
Creatinine, Ser: 0.69 mg/dL (ref 0.44–1.00)
GFR, Estimated: >60 mL/min (ref >60)
Glucose, Bld: 119 mg/dL — ABNORMAL HIGH (ref 70–99)
Potassium: 3.6 mmol/L (ref 3.5–5.1)
Sodium: 139 mmol/L (ref 135–145)
Total Bilirubin: 0.3 mg/dL (ref 0.3–1.2)
Total Protein: 7.3 g/dL (ref 6.5–8.1)

## 2021-04-07 LAB — CBC WITH DIFFERENTIAL/PLATELET
Abs Immature Granulocytes: 0.02 10*3/uL (ref 0.00–0.07)
Basophils Absolute: 0 10*3/uL (ref 0.0–0.1)
Basophils Relative: 0 %
Eosinophils Absolute: 0.2 10*3/uL (ref 0.0–0.5)
Eosinophils Relative: 2 %
HCT: 36.9 % (ref 36.0–46.0)
Hemoglobin: 11.8 g/dL — ABNORMAL LOW (ref 12.0–15.0)
Immature Granulocytes: 0 %
Lymphocytes Relative: 46 %
Lymphs Abs: 3.1 10*3/uL (ref 0.7–4.0)
MCH: 26.2 pg (ref 26.0–34.0)
MCHC: 32 g/dL (ref 30.0–36.0)
MCV: 81.8 fL (ref 80.0–100.0)
Monocytes Absolute: 0.6 10*3/uL (ref 0.1–1.0)
Monocytes Relative: 8 %
Neutro Abs: 3 10*3/uL (ref 1.7–7.7)
Neutrophils Relative %: 44 %
Platelets: 270 10*3/uL (ref 150–400)
RBC: 4.51 MIL/uL (ref 3.87–5.11)
RDW: 14.8 % (ref 11.5–15.5)
WBC: 6.9 10*3/uL (ref 4.0–10.5)
nRBC: 0 % (ref 0.0–0.2)

## 2021-04-07 LAB — I-STAT BETA HCG BLOOD, ED (MC, WL, AP ONLY): I-stat hCG, quantitative: <5 m[IU]/mL (ref <5)

## 2021-04-07 LAB — TROPONIN I (HIGH SENSITIVITY): Troponin I (High Sensitivity): 3 ng/L (ref <18)

## 2021-04-07 LAB — BRAIN NATRIURETIC PEPTIDE: B Natriuretic Peptide: 10.3 pg/mL (ref 0.0–100.0)

## 2021-04-07 LAB — LIPASE, BLOOD: Lipase: 33 U/L (ref 11–51)

## 2021-04-07 NOTE — ED Provider Notes (Signed)
[facility] EMERGENCY DEPARTMENT Provider Note   CSN: [id number] Arrival date & time: 04/07/21  1026     History  Chief Complaint  Patient presents with   Chest Pain   Shortness of Breath   Leg Swelling    Christina Vance is a 51 y.o. female.  51 year old female with prior medical history as detailed below presents for evaluation.  Patient complains of diffuse complaints including chest pain, shortness of breath, lower extremity swelling, weakness, fatigue.  Patient's symptoms have been ongoing for at least 2 to 3 months.  Patient was told at her last visit with her PCP that her sugars are running high.  She was advised to exercise more and adjust her diet.  She was not started on medications for diabetes.  The history is provided by the patient and medical records. A language interpreter was used.  Chest Pain Pain location:  Unable to specify Pain quality: aching   Pain radiates to:  Does not radiate Pain severity:  Unable to specify Onset quality:  Unable to specify Duration:  3 months Timing:  Constant Progression:  Waxing and waning Associated symptoms: shortness of breath   Shortness of Breath Associated symptoms: chest pain       Home Medications Prior to Admission medications   Medication Sig Start Date End Date Taking? Authorizing Provider  acetaminophen (TYLENOL) 500 MG tablet Take 500 mg by mouth daily as needed for headache.    [provider]  Cholecalciferol (VITAMIN D3) 1.25 MG (50000 UT) CAPS Take 50,000 Units by mouth once a week. Fridays 05/10/20   [provider]  Cyanocobalamin (VITAMIN B-12 PO) Take 1 tablet by mouth daily at 6 (six) AM.    [provider]  omeprazole (PRILOSEC) 20 MG capsule Take 1 capsule by mouth in the morning and at bedtime. 07/26/20   [provider]  diphenhydrAMINE (BENADRYL) 25 MG tablet Take 1 tablet (25 mg total) by mouth every 6 (six) hours as needed for itching  (Rash). Patient not taking: Reported on 08/27/2017 05/15/15 04/20/19  Roxy Horseman, PA-C      Allergies    Ibuprofen, Penicillins, Shrimp flavor, and Pork-derived products    Review of Systems   Review of Systems  Respiratory:  Positive for shortness of breath.   Cardiovascular:  Positive for chest pain.  All other systems reviewed and are negative.  Physical Exam Updated Vital Signs BP 105/84    Pulse 76    Temp 98.5 F (36.9 C) (Oral)    Resp 12    Ht 5\' 2"  (1.575 m)    Wt 88 kg    LMP 02/13/2021    SpO2 100%    BMI 35.48 kg/m  Physical Exam Vitals and nursing note reviewed.  Constitutional:      General: She is not in acute distress.    Appearance: Normal appearance. She is well-developed.  HENT:     Head: Normocephalic and atraumatic.  Eyes:     Conjunctiva/sclera: Conjunctivae normal.     Pupils: Pupils are equal, round, and reactive to light.  Cardiovascular:     Rate and Rhythm: Normal rate and regular rhythm.     Heart sounds: Normal heart sounds.  Pulmonary:     Effort: Pulmonary effort is normal. No respiratory distress.     Breath sounds: Normal breath sounds.  Abdominal:     General: There is no distension.     Palpations: Abdomen is soft.     Tenderness:  There is no abdominal tenderness.  Musculoskeletal:        General: No deformity. Normal range of motion.     Cervical back: Normal range of motion and neck supple.  Skin:    General: Skin is warm and dry.  Neurological:     General: No focal deficit present.     Mental Status: She is alert and oriented to person, place, and time.    ED Results / Procedures / Treatments   Labs (all labs ordered are listed, but only abnormal results are displayed) Labs Reviewed  COMPREHENSIVE METABOLIC PANEL - Abnormal; Notable for the following components:      Result Value   Glucose, Bld 119 (*)    All other components within normal limits  CBC WITH DIFFERENTIAL/PLATELET - Abnormal; Notable for the following  components:   Hemoglobin 11.8 (*)    All other components within normal limits  LIPASE, BLOOD  BRAIN NATRIURETIC PEPTIDE  I-STAT BETA HCG BLOOD, ED ([facility], [facility], [facility] ONLY)  TROPONIN I (HIGH SENSITIVITY)    EKG EKG Interpretation  Date/Time:  Friday April 07 2021 10:32:25 EST Ventricular Rate:  88 PR Interval:  132 QRS Duration: 82 QT Interval:  364 QTC Calculation: 440 R Axis:   11 Text Interpretation: Normal sinus rhythm Nonspecific ST and T wave abnormality Abnormal ECG When compared with ECG of 15-Jun-2020 15:03, PREVIOUS ECG IS PRESENT Confirmed by Kristine Royal [phone]) on 04/07/2021 12:00:36 PM  Radiology DG Chest 2 View  Result Date: 04/07/2021 CLINICAL DATA:  Shortness of breath, chest pain EXAM: CHEST - 2 VIEW COMPARISON:  Radiograph 06/15/2020 FINDINGS: The heart size and mediastinal contours are within normal limits. Both lungs are clear. The visualized skeletal structures are unremarkable. IMPRESSION: No evidence of acute cardiopulmonary disease. Electronically Signed   By: Caprice Renshaw M.D.   On: 04/07/2021 11:03    Procedures Procedures    Medications Ordered in ED Medications - No data to display  ED Course/ Medical Decision Making/ A&P                           Medical Decision Making   Medical Screen Complete  This patient presented to the ED with complaint of chest discomfort.  This complaint involves an extensive number of treatment options. The initial differential diagnosis includes, but is not limited to, ACS, Metabolic abnormality, etc  This presentation is: Chronic, Self-Limited, Previously Undiagnosed, Uncertain Prognosis, Complicated, Systemic Symptoms, and Threat to Life/Bodily Function  Patient is presenting with a host of complaints.  Describe symptoms are not consistent with likely ACS or other significant acute emergency.  Patient with ongoing symptoms for at least the last 2 to 3 months.  Screening labs obtained are without significant  abnormality.  Patient is reassured by this work-up.  She does understand need for close follow-up in the outpatient setting.  Strict return precautions given and understood.     Additional history obtained:  External records from outside sources obtained and reviewed including prior ED visits and prior Inpatient records.    Lab Tests:  I ordered and personally interpreted labs.  The pertinent results include: CBC, CMP, troponin, bnp   Imaging Studies ordered:  I ordered imaging studies including cxr  I independently visualized and interpreted obtained imaging which showed NAD I agree with the radiologist interpretation.   Cardiac Monitoring:  The patient was maintained on a cardiac monitor.  I personally viewed and interpreted the cardiac monitor which  showed an underlying rhythm of: NSR     Problem List / ED Course:  Atypical chest pain   Reevaluation:  After the interventions noted above, I reevaluated the patient and found that they have: resolved   Disposition:  After consideration of the diagnostic results and the patients response to treatment, I feel that the patent would benefit from close outpatient follow up.          Final Clinical Impression(s) / ED Diagnoses Final diagnoses:  Atypical chest pain    Rx / DC Orders ED Discharge Orders     None         Wynetta Fines, MD 04/07/21 [phone]

## 2021-04-07 NOTE — Discharge Instructions (Signed)
Return for any problem.  Drink plenty of fluids.  If you develop edema at the end of the day make sure you elevate your legs.  Follow-up closely with your regular care provider as instructed.

## 2021-04-07 NOTE — ED Provider Triage Note (Signed)
Emergency Medicine Provider Triage Evaluation Note  Christina Vance , a 51 y.o. female  was evaluated in triage.  Pt complains of symptoms beginning since December 2022.  She has had swelling in her extremities, abdomen.  Also complaining of headache not improved with medication.  Also complaining of chest pain and shortness of breath.  She has had all of the symptoms in the past but since December they have been persistent every day.  Denies any fevers.  Review of Systems  Positive: Edema, shortness of breath, chest pain, headache Negative: Fever  Physical Exam  BP 132/85 (BP Location: Right Arm)    Pulse 83    Temp 98.5 F (36.9 C) (Oral)    Resp 16    LMP 02/13/2021    SpO2 99%  Gen:   Awake, no distress   Resp:  Normal effort  MSK:   Moves extremities without difficulty  Other:  Edema noted to bilateral lower extremities  Medical Decision Making  Medically screening exam initiated at 10:44 AM.  Appropriate orders placed.  Christina Vance was informed that the remainder of the evaluation will be completed by another provider, this initial triage assessment does not replace that evaluation, and the importance of remaining in the ED until their evaluation is complete.  Labs ordered no acute distress noted   [name], PA-C 04/07/21 1044

## 2021-04-07 NOTE — ED Triage Notes (Signed)
Pt/translator stated, Christina Vance not felt good since December, Ive had swelling in my hands and feet, ankles. I also have SOB and its everyday. Ive been having dizziness and my mouth is dry.I also have chest pain when I can't breath

## 2021-04-07 NOTE — ED Notes (Addendum)
Pt reports no chest pain at this time. Using the interpretor she describes increased swelling in hands and feet. Swelling is noticeable in both areas but cap refill is normal. She stands all day at work. She endorses shortness of breath and headaches. Pt explains that she has been having abdominal pain that radiates to her low back, she feels nausea and gassy. She has been having difficulties w/ BM's LBM was this AM. She reports increased urinary frequency and fowl odor. She has hx of gallbladder issues, and she had lab work done recently that had indicated her kidneys weren't functioning adequately. All of these symptoms have been ongoing for 2 months

## 2021-04-07 NOTE — ED Notes (Signed)
Pt is concerned her symptoms may be associated with high BP

## 2021-04-19 ENCOUNTER — Other Ambulatory Visit (HOSPITAL_COMMUNITY)
Admission: RE | Admit: 2021-04-19 | Discharge: 2021-04-19 | Disposition: A | Discharge status: Home / Self Care | Payer: 59 | Source: Ambulatory Visit | Attending: Obstetrics and Gynecology | Admitting: Obstetrics and Gynecology

## 2021-04-19 ENCOUNTER — Other Ambulatory Visit: Payer: Self-pay

## 2021-04-19 ENCOUNTER — Encounter: Payer: Self-pay | Admitting: Obstetrics and Gynecology

## 2021-04-19 ENCOUNTER — Ambulatory Visit (INDEPENDENT_AMBULATORY_CARE_PROVIDER_SITE_OTHER): Payer: 59 | Admitting: Obstetrics and Gynecology

## 2021-04-19 DIAGNOSIS — Z01419 Encounter for gynecological examination (general) (routine) without abnormal findings: Secondary | ICD-10-CM | POA: Insufficient documentation

## 2021-04-19 DIAGNOSIS — Z1231 Encounter for screening mammogram for malignant neoplasm of breast: Secondary | ICD-10-CM

## 2021-04-19 DIAGNOSIS — N951 Menopausal and female climacteric states: Secondary | ICD-10-CM

## 2021-04-19 MED ORDER — PREMPRO 0.625-2.5 MG PO TABS: 1.0000 | ORAL_TABLET | Freq: Every day | ORAL | 11 refills | Status: DC

## 2021-04-19 NOTE — Patient Instructions (Signed)
Menopausia Menopause La menopausia es el perodo normal de la vida de una mujer en el que los perodos menstruales cesan por completo. Marca el fin natural de la capacidad de [place] mujer de [place]. Se puede definir como la ausencia del perodo menstrual durante 12 meses sin otra causa mdica. La transicin a Secondary school teacher (perimenopausia) con mayor frecuencia ocurre entre los 45 y los [address], y puede durar [employer]. Durante la perimenopausia, los niveles hormonales cambian en el [place], lo que puede provocar sntomas y Ship broker. La menopausia podra aumentar el riesgo de sufrir lo siguiente: Huesos debilitados (osteoporosis), lo cual causa fracturas. Depresin. Endurecimiento y Scientist, research (medical) de las arterias (aterosclerosis), lo que puede causar infartos de miocardio y accidentes cerebrovasculares. Cules son las causas? A menudo, la causa de esta afeccin es un cambio natural en los niveles hormonales, que ocurre a medida que envejece. La menopausia tambin puede ser causada por cambios que no son naturales, entre ellos: [place] para extirpar ambos ovarios (menopausia [place]). Efectos secundarios de [employer], como la quimioterapia [place] para tratar el cncer (menopausia qumica). Qu incrementa el riesgo? Es ms probable que la menopausia comience a una edad ms temprana si tiene ciertas afecciones o se ha realizado ciertos tratamientos, incluidos los siguientes: Un tumor en la hipfisis del cerebro. Una enfermedad que afecte los ovarios y las hormonas. Ciertos tratamientos para el cncer, como quimioterapia o terapia hormonal, o radioterapia en la pelvis. Fumar mucho y consumir alcohol de forma excesiva. Antecedentes familiares de menopausia temprana. Adems, es ms probable que esta afeccin se presente de manera temprana en las mujeres que son [place]. Cules son los signos o sntomas? Los sntomas de esta afeccin  incluyen: Engineer, maintenance. Perodos menstruales irregulares. Sudoracin nocturna. Cambios en el sentimiento respecto de las [employer]. Es posible que el deseo sexual disminuya o que se sienta ms incmoda respecto de su sexualidad. Sequedad vaginal y adelgazamiento de las paredes de la vagina. Esto puede causar [employer]. Sequedad de la piel y aparicin de Banker. Dolores de [place]. Problemas para dormir (insomnio). Cambios de humor o irritabilidad. Problemas de memoria. Aumento de [place]. Crecimiento de vello en la cara y el pecho. Infecciones en la vejiga o problemas para orinar. Cmo se diagnostica? Esta afeccin se diagnostica en funcin de los antecedentes mdicos, un examen fsico, la edad, los antecedentes menstruales y los sntomas. Tambin le podran realizar estudios hormonales. Cmo se trata? En algunos los casos, no se necesita tratamiento. Usted y el mdico deben decidir juntos si se debe Pensions consultant. El tratamiento se determinar en funcin de su cuadro clnico y de sus preferencias. El tratamiento de este cuadro clnico se centra en el control de los sntomas. El tratamiento puede incluir: Terapia hormonal para la menopausia. Medicamentos para tratar sntomas o complicaciones especficos. Acupuntura. Vitaminas o suplementos herbales. Antes de Microbiologist, es importante que le avise al mdico si tiene antecedentes personales o familiares de estas afecciones: Enfermedad cardaca. Cncer de mama. Cogulos de [place]. Diabetes. Osteoporosis. Siga estas instrucciones en su casa: Estilo de vida No consuma ningn producto que contenga nicotina o tabaco, como cigarrillos, cigarrillos electrnicos y tabaco de Theatre manager. Si necesita ayuda para dejar de consumir estos productos, consulte al mdico. Realice, por lo menos, 30 minutos de actividad fsica 5 das por semana o ms. Evite las bebidas con alcohol o cafena, as como las  [employer]. Esto podra ayudar a prevenir los acaloramientos. Intente dormir de 7 a 8 horas  todas las noches. Si tiene acaloramientos: Statistician. Evite las cosas que podran [employer] acaloramientos, como las comidas muy condimentadas, los lugares calientes o el estrs. Respire profundamente y despacio cuando comience a Scientist, water quality. Tenga un ventilador en su casa y en su oficina. Encuentre modos de [employer], por ejemplo, a travs de la respiracin, meditacin o un diario ntimo. Considere la posibilidad de asistir a terapia grupal con otras mujeres que tengan sntomas de [place]. [place]dale recomendaciones al [employer] reuniones de terapia grupal. Comida y bebida  Siga una dieta saludable y equilibrada que incluya cereales integrales, protenas magras, productos lcteos descremados y [place] frutas y verduras. El mdico podra recomendarle que agregue una mayor cantidad de soja a su dieta. Algunos de los alimentos que contienen soja son el tofu, el tempeh y la [place] de soja. Consuma muchos alimentos que contengan calcio y vitamina D para mejorar la salud sea. Algunos productos que contienen mucho calcio son los [employer] Products, el yogur, los frijoles, las Zeba, las sardinas, el brcoli y la col rizada. Medicamentos Use los medicamentos de venta libre y los recetados solamente como se lo haya indicado el mdico. Hable con el mdico antes de Corporate investment banker a Health visitor suplemento herbal. Tome las vitaminas y los suplementos recetados como se lo haya indicado el mdico. Indicaciones generales  Lleve un registro de sus perodos menstruales; incluya lo siguiente: El momento en que ocurren. Qu tan abundantes son y cunto duran. Cunto tiempo transcurre entre cada perodo menstrual. Lleve un registro de los sntomas; anote cundo comienzan, con qu frecuencia ocurren y cunto duran. Use lubricantes o humectantes vaginales para aliviar la  sequedad vaginal y Personnel officer durante las relaciones sexuales. Cumpla con todas las visitas de seguimiento. Esto es importante. Esto incluye la terapia grupal y la psicoterapia. Comunquese con un mdico si: An tiene perodos menstruales despus de los 55 aos. Siente dolor durante las [employer]. No tuvo perodos menstruales durante los ltimos 12 meses y presenta sangrado vaginal. Solicite ayuda de inmediato si tiene: Depresin grave. Sangrado vaginal excesivo. Dolor al Beatrix Shipper. Latidos cardacos rpidos o irregulares (palpitaciones). Dolor de cabeza intenso. Dolor en el abdomen o indigestin grave. Resumen La menopausia es un perodo normal de la vida de Nurse, mental health en el que los perodos menstruales cesan por completo. Se define normalmente como la ausencia del periodo menstrual durante 12 meses sin otra causa mdica. La transicin a Secondary school teacher (perimenopausia) con mayor frecuencia ocurre entre los 45 y los [address], y puede durar varios aos. Los sntomas pueden controlarse mediante medicamentos, cambios en el estilo de vida y terapias complementarias, como acupuntura. Siga una dieta equilibrada que incluya muchos nutrientes para Biochemist, clinical salud sea y la salud cardaca, y para Chief Operating Officer los sntomas durante la [place]. Esta informacin no tiene Theme park manager el consejo del mdico. Asegrese de hacerle al mdico cualquier pregunta que tenga. Document Revised: 09/15/2019 Document Reviewed: 09/15/2019 Elsevier Patient Education  2022 Elsevier Inc. Mantenimiento de la salud en las mujeres Health Maintenance, Female Adoptar un estilo de vida saludable y recibir atencin preventiva son importantes para promover la salud y Counsellor. Consulte al mdico sobre: El esquema adecuado para hacerse pruebas y exmenes peridicos. Cosas que puede hacer por su cuenta para prevenir enfermedades y Baxter sano. Qu debo saber sobre la dieta, el peso y el  ejercicio? Consuma una dieta saludable  Consuma una dieta que incluya muchas verduras, frutas, productos lcteos con bajo contenido de [place] y protenas  magras. No consuma muchos alimentos ricos en grasas slidas, azcares agregados o sodio. Mantenga un peso saludable El ndice de masa muscular [facility]) se [place] para identificar problemas de [place]. Proporciona una estimacin de la grasa corporal basndose en el peso y la altura. Su mdico puede ayudarle a Engineer, site IMC y a Personnel officer o Pharmacologist un peso saludable. Haga ejercicio con regularidad Haga ejercicio con regularidad. Esta es una de las prcticas ms importantes que puede hacer por su salud. La [employer]son de los adultos deben seguir estas pautas: Education officer, environmental, al menos, 150 minutos de actividad fsica por semana. El ejercicio debe aumentar la frecuencia cardaca y Media planner transpirar (ejercicio de intensidad moderada). Hacer ejercicios de fortalecimiento por lo [employer] por semana. Agregue esto a su plan de ejercicio de intensidad moderada. Pase menos tiempo sentada. Incluso la actividad fsica ligera puede ser beneficiosa. Controle sus niveles de colesterol y lpidos en la sangre Comience a realizarse anlisis de lpidos y Oncologist en la sangre a los 20 aos y luego reptalos cada 5 aos. Hgase controlar los niveles de colesterol con mayor frecuencia si: Sus niveles de lpidos y colesterol son altos. Es mayor de 40 aos. Presenta un alto riesgo de padecer enfermedades cardacas. Qu debo saber sobre las pruebas de deteccin del cncer? Segn su historia clnica y sus antecedentes familiares, es posible que deba realizarse pruebas de deteccin del cncer en diferentes edades. Esto puede incluir pruebas de deteccin de lo siguiente: Cncer de mama. Cncer de cuello uterino. Cncer colorrectal. Cncer de piel. Cncer de pulmn. Qu debo saber sobre la enfermedad cardaca, la diabetes y la hipertensin arterial? Presin arterial y  enfermedad cardaca La hipertensin arterial causa enfermedades cardacas y [place] el riesgo de accidente cerebrovascular. Es ms probable que esto se manifieste en las personas que tienen lecturas de presin arterial alta o tienen sobrepeso. Hgase controlar la presin arterial: Cada 3 a 5 aos si tiene entre 18 y 62 aos. Todos los aos si es mayor de 40 aos. Diabetes Realcese exmenes de deteccin de la diabetes con regularidad. Este anlisis revisa el nivel de azcar en la sangre en [place]. Hgase las pruebas de deteccin: Cada tres aos despus de los 40 aos de edad si tiene un peso normal y un bajo riesgo de padecer diabetes. Con ms frecuencia y a partir de [place] edad inferior si tiene sobrepeso o un alto riesgo de padecer diabetes. Qu debo saber sobre la prevencin de infecciones? Hepatitis B Si tiene un riesgo ms alto de contraer hepatitis B, debe someterse a un examen de deteccin de este virus. Hable con el mdico para averiguar si tiene riesgo de contraer la infeccin por hepatitis B. Hepatitis C Se recomienda el anlisis a: [employer] Corporation 1944 y 1965. Todas las personas que tengan un riesgo de haber contrado hepatitis C. Enfermedades de transmisin sexual (ETS) Hgase las pruebas de Airline pilot de ITS, incluidas la gonorrea y la clamidia, si: Es sexualmente activa y es menor de [address]. Es mayor de [address], y Public affairs consultant informa que corre riesgo de tener este tipo de infecciones. La actividad sexual ha cambiado desde que le hicieron la ltima prueba de deteccin y tiene un riesgo mayor de Warehouse manager clamidia o Copy. Pregntele al mdico si usted tiene riesgo. Pregntele al mdico si usted tiene un alto riesgo de Primary school teacher VIH. El mdico tambin puede recomendarle un medicamento recetado para ayudar a evitar la infeccin por el VIH. Si elige tomar medicamentos para prevenir el VIH,  primero debe ONEOKhacerse los anlisis de deteccin del VIH. Luego debe hacerse anlisis  cada 3 meses mientras est tomando los medicamentos. Embarazo Si est por dejar de Armed forces training and education officermenstruar (fase premenopusica) y usted puede quedar Ches[place]oldembarazada, busque asesoramiento antes de [place]edar embarazada. Tome de 400 a 800 microgramos (mcg) de cido Ecolabflico todos los das si Norwayqueda embarazada. Pida mtodos de control de la natalidad (anticonceptivos) si desea evitar un embarazo no deseado. Osteoporosis y Rw[place]amenopausia La osteoporosis es una enfermedad en la que los huesos pierden los minerales y la fuerza por el avance de la edad. El resultado pueden ser fracturas en los Commercehuesos. Si tiene 65 aos o ms, o si est en riesgo de sufrir osteoporosis y fracturas, pregunte a su mdico si debe: Hacerse pruebas de deteccin de prdida sea. Tomar un suplemento de calcio o de vitamina D para reducir el riesgo de fracturas. Recibir terapia de reemplazo hormonal (TRH) para tratar los sntomas de la menopausia. Siga estas indicaciones en su casa: Consumo de alcohol No beba alcohol si: Su mdico le indica no hacerlo. Est embarazada, puede estar embarazada o est tratando de Burundiquedar embarazada. Si bebe alcohol: Limite la cantidad que bebe a lo siguiente: De 0 a 1 bebida por da. Sepa cunta cantidad de alcohol hay en las bebidas que toma. En los [address], una medida equivale a una botella de cerveza de 12 oz (355 ml), un vaso de vino de 5 oz (148 ml) o un vaso de una bebida alcohlica de alta graduacin de 1 oz (44 ml). Estilo de vida No consuma ningn producto que contenga nicotina o tabaco. Estos productos incluyen cigarrillos, tabaco para Theatre managermascar y aparatos de vapeo, como los Administrator, Civil Servicecigarrillos electrnicos. Si necesita ayuda para dejar de consumir estos productos, consulte al mdico. No consuma drogas. No comparta agujas. Solicite ayuda a su mdico si necesita apoyo o informacin para abandonar las drogas. Indicaciones generales Realcese los estudios de rutina de [address] salud, dentales y de Wellsite geologistla vista. Mantngase al da  con las vacunas. Infrmele a su mdico si: Se siente deprimida con frecuencia. Alguna vez ha sido vctima de [place]maltrato o no se siente seguro en su casa. Resumen Adoptar un estilo de vida saludable y recibir atencin preventiva son importantes para promover la salud y Counsellorel bienestar. Siga las instrucciones del mdico acerca de una dieta saludable, el ejercicio y la realizacin de pruebas o exmenes para Hotel managerdetectar enfermedades. Siga las instrucciones del mdico con respecto al control del colesterol y la presin arterial. Esta informacin no tiene Theme park managercomo fin reemplazar el consejo del mdico. Asegrese de hacerle al mdico cualquier pregunta que tenga. Document Revised: 07/21/2020 Document Reviewed: 07/21/2020 Elsevier Patient Education  2022 ArvinMeritorElsevier Inc.

## 2021-04-19 NOTE — Progress Notes (Signed)
New patient presents to establish care. Reports sweats, mood changes, headaches, irregular periods, change in appetite and feeling bad overall for 3 years, and want to discuss menopause. Denies urinary or vaginal symptoms. PHQ9=19 GAD7=11.

## 2021-04-19 NOTE — Addendum Note (Signed)
Addended by: Huey Bienenstock on: 04/19/2021 03:02 PM   Modules accepted: Orders

## 2021-04-19 NOTE — Progress Notes (Signed)
Christina Vance is a 51 y.o. [id number] female here for a routine annual gynecologic exam.  Current complaints: Menopausal Sx.   Denies abnormal vaginal bleeding, discharge, pelvic pain, problems with intercourse or other gynecologic concerns.    Gynecologic History Patient's last menstrual period was 02/09/2021. Contraception: none Last Pap: 10 yrs ago. Results were: normal Last mammogram: 3-4 yrs ago. Results were: normal Colonoscopy 3-4 months ago normal per pt  Obstetric History OB History  Gravida Para Term Preterm AB Living  5 5 5  0 0 5  SAB IAB Ectopic Multiple Live Births               # Outcome Date GA Lbr Len/2nd Weight Sex Delivery Anes PTL Lv  5 Term 01/31/04    M CS-LTranv     4 Term 10/07/99    M Vag-Spont     3 Term 03/27/94    M      2 Term 03/04/90    M Vag-Spont     1 Term 07/11/89    F Vag-Spont       Past Medical History:  Diagnosis Date   Anemia    Asthma    Gastritis    GERD (gastroesophageal reflux disease)    on meds   Seasonal allergies     Past Surgical History:  Procedure Laterality Date   CESAREAN SECTION     WISDOM TOOTH EXTRACTION      Current Outpatient Medications on File Prior to Visit  Medication Sig Dispense Refill   acetaminophen (TYLENOL) 500 MG tablet Take 500 mg by mouth daily as needed for headache.     famotidine (PEPCID) 10 MG tablet Take 10 mg by mouth 2 (two) times daily.     omeprazole (PRILOSEC) 20 MG capsule Take 1 capsule by mouth in the morning and at bedtime.     Cholecalciferol (VITAMIN D3) 1.25 MG (50000 UT) CAPS Take 50,000 Units by mouth once a week. Fridays (Patient not taking: Reported on 04/19/2021)     Cyanocobalamin (VITAMIN B-12 PO) Take 1 tablet by mouth daily at 6 (six) AM. (Patient not taking: Reported on 04/19/2021)     [DISCONTINUED] diphenhydrAMINE (BENADRYL) 25 MG tablet Take 1 tablet (25 mg total) by mouth every 6 (six) hours as needed for itching (Rash). (Patient not taking: Reported on 08/27/2017)  30 tablet 0   No current facility-administered medications on file prior to visit.    Allergies  Allergen Reactions   Ibuprofen Swelling   Penicillins Swelling    Has patient had a PCN reaction causing immediate rash, facial/tongue/throat swelling, SOB or lightheadedness with hypotension: No Has patient had a PCN reaction causing severe rash involving mucus membranes or skin necrosis: No Has patient had a PCN reaction that required hospitalization No Has patient had a PCN reaction occurring within the last 10 years: No If all of the above answers are "NO", then may proceed with Cephalosporin use.   Shrimp Flavor Hives and Itching   Pork-Derived Products Rash    Social History   Socioeconomic History   Marital status: Divorced    Spouse name: Not on file   Number of children: Not on file   Years of education: Not on file   Highest education level: Not on file  Occupational History   Not on file  Tobacco Use   Smoking status: Never   Smokeless tobacco: Never  Vaping Use   Vaping Use: Never used  Substance and Sexual Activity   Alcohol use:  Not Currently   Drug use: Never   Sexual activity: Yes  Other Topics Concern   Not on file  Social History Narrative   Not on file   Social Determinants of Health   Financial Resource Strain: Not on file  Food Insecurity: Not on file  Transportation Needs: Not on file  Physical Activity: Not on file  Stress: Not on file  Social Connections: Not on file  Intimate Partner Violence: Not on file    Family History  Problem Relation Age of Onset   Diabetes Mother    Diabetes Maternal Grandmother    Stomach cancer Maternal Uncle    Alcoholism Maternal Uncle    Colon cancer Neg Hx    Colon polyps Neg Hx    Esophageal cancer Neg Hx    Rectal cancer Neg Hx     The following portions of the patient's history were reviewed and updated as appropriate: allergies, current medications, past family history, past medical history, past  social history, past surgical history and problem list.  Review of Systems Pertinent items noted in HPI and remainder of comprehensive ROS otherwise negative.   Objective:  BP 116/78    Pulse 78    Ht 5\' 1"  (1.549 m)    Wt 188 lb 8 oz (85.5 kg)    LMP 02/09/2021    BMI 35.62 kg/m  CONSTITUTIONAL: Well-developed, well-nourished female in no acute distress.  HENT:  Normocephalic, atraumatic, External right and left ear normal. Oropharynx is clear and moist EYES: Conjunctivae and EOM are normal. Pupils are equal, round, and reactive to light. No scleral icterus.  NECK: Normal range of motion, supple, no masses.  Normal thyroid.  SKIN: Skin is warm and dry. No rash noted. Not diaphoretic. No erythema. No pallor. NEUROLGIC: Alert and oriented to person, place, and time. Normal reflexes, muscle tone coordination. No cranial nerve deficit noted. PSYCHIATRIC: Normal mood and affect. Normal behavior. Normal judgment and thought content. CARDIOVASCULAR: Normal heart rate noted, regular rhythm RESPIRATORY: Clear to auscultation bilaterally. Effort and breath sounds normal, no problems with respiration noted. BREASTS: Symmetric in size. No masses, skin changes, nipple drainage, or lymphadenopathy. ABDOMEN: Soft, normal bowel sounds, no distention noted.  No tenderness, rebound or guarding.  PELVIC: Normal appearing external genitalia; normal appearing vaginal mucosa and cervix.  No abnormal discharge noted.  Pap smear obtained.  Normal uterine size, no other palpable masses, no uterine or adnexal tenderness. MUSCULOSKELETAL: Normal range of motion. No tenderness.  No cyanosis, clubbing, or edema.  2+ distal pulses.   Assessment:  Annual gynecologic examination with pap smear Menopausal Sx Plan:  Will follow up results of pap smear and manage accordingly. Mammogram scheduled Discussed treatment options for menopausal Sx. R/B of each reviewed. Following discussion, pt desires to try HRT. Rx to  pharmacy. F/U in 2 months to  gage response.  Routine preventative health maintenance measures emphasized. Please refer to After Visit Summary for other counseling recommendations.    02/11/2021, MD, FACOG Attending Obstetrician & Gynecologist [facility], [facility]

## 2021-04-20 LAB — COMPREHENSIVE METABOLIC PANEL
ALT: 36 IU/L — ABNORMAL HIGH (ref 0–32)
AST: 36 IU/L (ref 0–40)
Albumin/Globulin Ratio: 1.4 (ref 1.2–2.2)
Albumin: 4.3 g/dL (ref 3.8–4.8)
Alkaline Phosphatase: 139 IU/L — ABNORMAL HIGH (ref 44–121)
BUN/Creatinine Ratio: 21 (ref 9–23)
BUN: 14 mg/dL (ref 6–24)
Bilirubin Total: <0.2 mg/dL (ref 0.0–1.2)
CO2: 22 mmol/L (ref 20–29)
Calcium: 9.9 mg/dL (ref 8.7–10.2)
Chloride: 103 mmol/L (ref 96–106)
Creatinine, Ser: 0.68 mg/dL (ref 0.57–1.00)
Globulin, Total: 3 g/dL (ref 1.5–4.5)
Glucose: 82 mg/dL (ref 70–99)
Potassium: 4.7 mmol/L (ref 3.5–5.2)
Sodium: 140 mmol/L (ref 134–144)
Total Protein: 7.3 g/dL (ref 6.0–8.5)
eGFR: 107 mL/min/{1.73_m2} (ref >59)

## 2021-04-20 LAB — CBC
Hematocrit: 37 % (ref 34.0–46.6)
Hemoglobin: 11.8 g/dL (ref 11.1–15.9)
MCH: 25.7 pg — ABNORMAL LOW (ref 26.6–33.0)
MCHC: 31.9 g/dL (ref 31.5–35.7)
MCV: 81 fL (ref 79–97)
Platelets: 264 10*3/uL (ref 150–450)
RBC: 4.59 x10E6/uL (ref 3.77–5.28)
RDW: 14.3 % (ref 11.7–15.4)
WBC: 7.1 10*3/uL (ref 3.4–10.8)

## 2021-04-20 LAB — TSH: TSH: 1.17 u[IU]/mL (ref 0.450–4.500)

## 2021-04-24 ENCOUNTER — Telehealth: Payer: Self-pay

## 2021-04-24 NOTE — Telephone Encounter (Signed)
S/w pt via interpreter and advised of results and follow up labs, pt voiced understanding.

## 2021-04-25 LAB — CYTOLOGY - PAP
Adequacy: ABSENT
Comment: NEGATIVE
Diagnosis: NEGATIVE
High risk HPV: NEGATIVE

## 2021-05-01 ENCOUNTER — Inpatient Hospital Stay (HOSPITAL_BASED_OUTPATIENT_CLINIC_OR_DEPARTMENT_OTHER): Admission: RE | Admit: 2021-05-01 | Payer: 59 | Source: Ambulatory Visit | Admitting: Radiology

## 2021-05-04 ENCOUNTER — Encounter: Payer: 59 | Admitting: Family Medicine

## 2021-06-09 ENCOUNTER — Other Ambulatory Visit (HOSPITAL_COMMUNITY): Payer: Self-pay | Admitting: Family Medicine

## 2021-06-09 DIAGNOSIS — K8020 Calculus of gallbladder without cholecystitis without obstruction: Secondary | ICD-10-CM

## 2021-06-21 ENCOUNTER — Ambulatory Visit: Payer: 59 | Admitting: Obstetrics and Gynecology

## 2021-06-21 ENCOUNTER — Encounter: Payer: Self-pay | Admitting: Obstetrics and Gynecology

## 2021-06-21 VITALS — BP 113/78 | HR 99 | Wt 192.0 lb

## 2021-06-21 DIAGNOSIS — N951 Menopausal and female climacteric states: Secondary | ICD-10-CM

## 2021-06-21 MED ORDER — VENLAFAXINE HCL ER 75 MG PO CP24: 75.0000 mg | ORAL_CAPSULE | Freq: Every day | ORAL | 3 refills | Status: DC

## 2021-06-21 NOTE — Patient Instructions (Signed)
Menopausia Menopause La menopausia es el perodo normal de la vida de una mujer en el que los perodos menstruales cesan por completo. Marca el fin natural de la capacidad de una mujer de quedar embarazada. Se puede definir como la ausencia del perodo menstrual durante 12 meses sin otra causa mdica. La transicin a la menopausia (perimenopausia) con mayor frecuencia ocurre entre los 45 y los 55 aos, y puede durar muchos aos. Durante la perimenopausia, los niveles hormonales cambian en el organismo, lo que puede provocar sntomas y afectar la salud. La menopausia podra aumentar el riesgo de sufrir lo siguiente: Huesos debilitados (osteoporosis), lo cual causa fracturas. Depresin. Endurecimiento y estrechamiento de las arterias (aterosclerosis), lo que puede causar infartos de miocardio y accidentes cerebrovasculares. Cules son las causas? A menudo, la causa de esta afeccin es un cambio natural en los niveles hormonales, que ocurre a medida que envejece. La menopausia tambin puede ser causada por cambios que no son naturales, entre ellos: Ciruga para extirpar ambos ovarios (menopausia quirrgica). Efectos secundarios de algunos medicamentos, como la quimioterapia utilizada para tratar el cncer (menopausia qumica). Qu incrementa el riesgo? Es ms probable que la menopausia comience a una edad ms temprana si tiene ciertas afecciones o se ha realizado ciertos tratamientos, incluidos los siguientes: Un tumor en la hipfisis del cerebro. Una enfermedad que afecte los ovarios y las hormonas. Ciertos tratamientos para el cncer, como quimioterapia o terapia hormonal, o radioterapia en la pelvis. Fumar mucho y consumir alcohol de forma excesiva. Antecedentes familiares de menopausia temprana. Adems, es ms probable que esta afeccin se presente de manera temprana en las mujeres que son muy delgadas. Cules son los signos o sntomas? Los sntomas de esta afeccin  incluyen: Acaloramiento. Perodos menstruales irregulares. Sudoracin nocturna. Cambios en el sentimiento respecto de las relaciones sexuales. Es posible que el deseo sexual disminuya o que se sienta ms incmoda respecto de su sexualidad. Sequedad vaginal y adelgazamiento de las paredes de la vagina. Esto puede causar relaciones sexuales dolorosas. Sequedad de la piel y aparicin de arrugas. Dolores de cabeza. Problemas para dormir (insomnio). Cambios de humor o irritabilidad. Problemas de memoria. Aumento de peso. Crecimiento de vello en la cara y el pecho. Infecciones en la vejiga o problemas para orinar. Cmo se diagnostica? Esta afeccin se diagnostica en funcin de los antecedentes mdicos, un examen fsico, la edad, los antecedentes menstruales y los sntomas. Tambin le podran realizar estudios hormonales. Cmo se trata? En algunos los casos, no se necesita tratamiento. Usted y el mdico deben decidir juntos si se debe realizar un tratamiento. El tratamiento se determinar en funcin de su cuadro clnico y de sus preferencias. El tratamiento de este cuadro clnico se centra en el control de los sntomas. El tratamiento puede incluir: Terapia hormonal para la menopausia. Medicamentos para tratar sntomas o complicaciones especficos. Acupuntura. Vitaminas o suplementos herbales. Antes de comenzar el tratamiento, es importante que le avise al mdico si tiene antecedentes personales o familiares de estas afecciones: Enfermedad cardaca. Cncer de mama. Cogulos de sangre. Diabetes. Osteoporosis. Siga estas instrucciones en su casa: Estilo de vida No consuma ningn producto que contenga nicotina o tabaco, como cigarrillos, cigarrillos electrnicos y tabaco de mascar. Si necesita ayuda para dejar de consumir estos productos, consulte al mdico. Realice, por lo menos, 30 minutos de actividad fsica 5 das por semana o ms. Evite las bebidas con alcohol o cafena, as como las  comidas muy condimentadas. Esto podra ayudar a prevenir los acaloramientos. Intente dormir de 7 a 8 horas   todas las noches. Si tiene acaloramientos: Vstase en capas. Evite las cosas que podran desencadenar los acaloramientos, como las comidas muy condimentadas, los lugares calientes o el estrs. Respire profundamente y despacio cuando comience a sentir acaloramiento. Tenga un ventilador en su casa y en su oficina. Encuentre modos de controlar el estrs, por ejemplo, a travs de la respiracin, meditacin o un diario ntimo. Considere la posibilidad de asistir a terapia grupal con otras mujeres que tengan sntomas de menopausia. Pdale recomendaciones al mdico sobre reuniones de terapia grupal. Comida y bebida  Siga una dieta saludable y equilibrada que incluya cereales integrales, protenas magras, productos lcteos descremados y muchas frutas y verduras. El mdico podra recomendarle que agregue una mayor cantidad de soja a su dieta. Algunos de los alimentos que contienen soja son el tofu, el tempeh y la leche de soja. Consuma muchos alimentos que contengan calcio y vitamina D para mejorar la salud sea. Algunos productos que contienen mucho calcio son los lcteos descremados, el yogur, los frijoles, las almendras, las sardinas, el brcoli y la col rizada. Medicamentos Use los medicamentos de venta libre y los recetados solamente como se lo haya indicado el mdico. Hable con el mdico antes de empezar a tomar cualquier suplemento herbal. Tome las vitaminas y los suplementos recetados como se lo haya indicado el mdico. Indicaciones generales  Lleve un registro de sus perodos menstruales; incluya lo siguiente: El momento en que ocurren. Qu tan abundantes son y cunto duran. Cunto tiempo transcurre entre cada perodo menstrual. Lleve un registro de los sntomas; anote cundo comienzan, con qu frecuencia ocurren y cunto duran. Use lubricantes o humectantes vaginales para aliviar la  sequedad vaginal y mejorar el bienestar durante las relaciones sexuales. Cumpla con todas las visitas de seguimiento. Esto es importante. Esto incluye la terapia grupal y la psicoterapia. Comunquese con un mdico si: An tiene perodos menstruales despus de los 55 aos. Siente dolor durante las relaciones sexuales. No tuvo perodos menstruales durante los ltimos 12 meses y presenta sangrado vaginal. Solicite ayuda de inmediato si tiene: Depresin grave. Sangrado vaginal excesivo. Dolor al orinar. Latidos cardacos rpidos o irregulares (palpitaciones). Dolor de cabeza intenso. Dolor en el abdomen o indigestin grave. Resumen La menopausia es un perodo normal de la vida de una mujer en el que los perodos menstruales cesan por completo. Se define normalmente como la ausencia del periodo menstrual durante 12 meses sin otra causa mdica. La transicin a la menopausia (perimenopausia) con mayor frecuencia ocurre entre los 45 y los 55 aos, y puede durar varios aos. Los sntomas pueden controlarse mediante medicamentos, cambios en el estilo de vida y terapias complementarias, como acupuntura. Siga una dieta equilibrada que incluya muchos nutrientes para favorecer la salud sea y la salud cardaca, y para controlar los sntomas durante la menopausia. Esta informacin no tiene como fin reemplazar el consejo del mdico. Asegrese de hacerle al mdico cualquier pregunta que tenga. Document Revised: 09/15/2019 Document Reviewed: 09/15/2019 Elsevier Patient Education  2023 Elsevier Inc.  

## 2021-06-21 NOTE — Progress Notes (Signed)
Pt is here for HRT follow up. ?Pt states she only took medication x 3 days.  ?Pt states she stopped medication due to having increase in ?moles around neck.  ?Pt would like to discuss other medications.  ? ?

## 2021-06-21 NOTE — Progress Notes (Signed)
Ms Christina Vance presents for follow up of HRT. ?See prior office note. Pt reports took medications for @ 3 days and the stopped due to the way it made her feel. ?She would like to discuss other options. ? ?PE AF VSS ?Lungs clear Heart RRR ?Abd soft + BS ? ?A/P menopausal sx. ? ?Non hormonal Tx options reviewed with pt. Will try Effexor. U/R/B reviewed . Rx to pharmacy. F/U in 3 months to gage response.  ? ?Live interrupter used during today's visit ? ? ?

## 2021-06-22 ENCOUNTER — Ambulatory Visit (HOSPITAL_COMMUNITY): Admission: RE | Admit: 2021-06-22 | Payer: 59 | Source: Ambulatory Visit

## 2021-06-22 ENCOUNTER — Encounter (HOSPITAL_COMMUNITY): Payer: Self-pay

## 2021-06-30 ENCOUNTER — Ambulatory Visit (HOSPITAL_BASED_OUTPATIENT_CLINIC_OR_DEPARTMENT_OTHER): Payer: 59 | Admitting: Radiology

## 2021-08-18 ENCOUNTER — Ambulatory Visit (HOSPITAL_BASED_OUTPATIENT_CLINIC_OR_DEPARTMENT_OTHER)
Admission: RE | Admit: 2021-08-18 | Discharge: 2021-08-18 | Disposition: A | Discharge status: Home / Self Care | Payer: 59 | Source: Ambulatory Visit | Attending: Obstetrics and Gynecology | Admitting: Obstetrics and Gynecology

## 2021-08-18 DIAGNOSIS — Z1231 Encounter for screening mammogram for malignant neoplasm of breast: Secondary | ICD-10-CM | POA: Insufficient documentation

## 2021-08-21 ENCOUNTER — Encounter: Payer: Self-pay | Admitting: Family Medicine

## 2021-09-21 ENCOUNTER — Ambulatory Visit: Payer: 59 | Admitting: Obstetrics and Gynecology

## 2021-09-21 ENCOUNTER — Encounter: Payer: Self-pay | Admitting: Obstetrics and Gynecology

## 2021-09-21 VITALS — BP 121/86 | HR 80 | Ht 62.0 in | Wt 193.0 lb

## 2021-09-21 DIAGNOSIS — N951 Menopausal and female climacteric states: Secondary | ICD-10-CM | POA: Diagnosis not present

## 2021-09-21 NOTE — Progress Notes (Signed)
Patient ID: Kensey Luepke, female   DOB: [birth year], 51 y.o.   MRN: [id number] Ms Onalee Hua presents for f/u of her menopausal Sx. She tried HRT but did not like how it made her feel. At last visit switched to Effexor. She reports this made her drowsy and so stopped taking. She is using OTC K, Magnesium and Calcium plus diet and exercise for her menopausal Sx. She reports this is making the Sx tolerable.   PE AF VSS Lungs clear Heart RRR Abd soft + BS   A/P Menopausal Sx  Pt desires to continue with her OTC meds for now. F/U PRN

## 2021-09-21 NOTE — Progress Notes (Signed)
51 y.o presents for Follow up of HRT.  Pt stated that stopped taking the Rx because it made her drowsy during the day.

## 2021-09-21 NOTE — Patient Instructions (Signed)
Menopausia Menopause La menopausia es el perodo normal de la vida de una mujer en el que los perodos menstruales cesan por completo. Marca el fin natural de la capacidad de una mujer de quedar embarazada. Se puede definir como la ausencia del perodo menstrual durante 12 meses sin otra causa mdica. La transicin a la menopausia (perimenopausia) con mayor frecuencia ocurre entre los 45 y los 55 aos, y puede durar muchos aos. Durante la perimenopausia, los niveles hormonales cambian en el organismo, lo que puede provocar sntomas y afectar la salud. La menopausia podra aumentar el riesgo de sufrir lo siguiente: Huesos debilitados (osteoporosis), lo cual causa fracturas. Depresin. Endurecimiento y estrechamiento de las arterias (aterosclerosis), lo que puede causar infartos de miocardio y accidentes cerebrovasculares. Cules son las causas? A menudo, la causa de esta afeccin es un cambio natural en los niveles hormonales, que ocurre a medida que envejece. La menopausia tambin puede ser causada por cambios que no son naturales, entre ellos: Ciruga para extirpar ambos ovarios (menopausia quirrgica). Efectos secundarios de algunos medicamentos, como la quimioterapia utilizada para tratar el cncer (menopausia qumica). Qu incrementa el riesgo? Es ms probable que la menopausia comience a una edad ms temprana si tiene ciertas afecciones o se ha realizado ciertos tratamientos, incluidos los siguientes: Un tumor en la hipfisis del cerebro. Una enfermedad que afecte los ovarios y las hormonas. Ciertos tratamientos para el cncer, como quimioterapia o terapia hormonal, o radioterapia en la pelvis. Fumar mucho y consumir alcohol de forma excesiva. Antecedentes familiares de menopausia temprana. Adems, es ms probable que esta afeccin se presente de manera temprana en las mujeres que son muy delgadas. Cules son los signos o sntomas? Los sntomas de esta afeccin  incluyen: Acaloramiento. Perodos menstruales irregulares. Sudoracin nocturna. Cambios en el sentimiento respecto de las relaciones sexuales. Es posible que el deseo sexual disminuya o que se sienta ms incmoda respecto de su sexualidad. Sequedad vaginal y adelgazamiento de las paredes de la vagina. Esto puede causar relaciones sexuales dolorosas. Sequedad de la piel y aparicin de arrugas. Dolores de cabeza. Problemas para dormir (insomnio). Cambios de humor o irritabilidad. Problemas de memoria. Aumento de peso. Crecimiento de vello en la cara y el pecho. Infecciones en la vejiga o problemas para orinar. Cmo se diagnostica? Esta afeccin se diagnostica en funcin de los antecedentes mdicos, un examen fsico, la edad, los antecedentes menstruales y los sntomas. Tambin le podran realizar estudios hormonales. Cmo se trata? En algunos los casos, no se necesita tratamiento. Usted y el mdico deben decidir juntos si se debe realizar un tratamiento. El tratamiento se determinar en funcin de su cuadro clnico y de sus preferencias. El tratamiento de este cuadro clnico se centra en el control de los sntomas. El tratamiento puede incluir: Terapia hormonal para la menopausia. Medicamentos para tratar sntomas o complicaciones especficos. Acupuntura. Vitaminas o suplementos herbales. Antes de comenzar el tratamiento, es importante que le avise al mdico si tiene antecedentes personales o familiares de estas afecciones: Enfermedad cardaca. Cncer de mama. Cogulos de sangre. Diabetes. Osteoporosis. Siga estas instrucciones en su casa: Estilo de vida No consuma ningn producto que contenga nicotina o tabaco, como cigarrillos, cigarrillos electrnicos y tabaco de mascar. Si necesita ayuda para dejar de consumir estos productos, consulte al mdico. Realice, por lo menos, 30 minutos de actividad fsica 5 das por semana o ms. Evite las bebidas con alcohol o cafena, as como las  comidas muy condimentadas. Esto podra ayudar a prevenir los acaloramientos. Intente dormir de 7 a 8 horas   todas las noches. Si tiene acaloramientos: Vstase en capas. Evite las cosas que podran desencadenar los acaloramientos, como las comidas muy condimentadas, los lugares calientes o el estrs. Respire profundamente y despacio cuando comience a sentir acaloramiento. Tenga un ventilador en su casa y en su oficina. Encuentre modos de controlar el estrs, por ejemplo, a travs de la respiracin, meditacin o un diario ntimo. Considere la posibilidad de asistir a terapia grupal con otras mujeres que tengan sntomas de menopausia. Pdale recomendaciones al mdico sobre reuniones de terapia grupal. Comida y bebida  Siga una dieta saludable y equilibrada que incluya cereales integrales, protenas magras, productos lcteos descremados y muchas frutas y verduras. El mdico podra recomendarle que agregue una mayor cantidad de soja a su dieta. Algunos de los alimentos que contienen soja son el tofu, el tempeh y la leche de soja. Consuma muchos alimentos que contengan calcio y vitamina D para mejorar la salud sea. Algunos productos que contienen mucho calcio son los lcteos descremados, el yogur, los frijoles, las almendras, las sardinas, el brcoli y la col rizada. Medicamentos Use los medicamentos de venta libre y los recetados solamente como se lo haya indicado el mdico. Hable con el mdico antes de empezar a tomar cualquier suplemento herbal. Tome las vitaminas y los suplementos recetados como se lo haya indicado el mdico. Indicaciones generales  Lleve un registro de sus perodos menstruales; incluya lo siguiente: El momento en que ocurren. Qu tan abundantes son y cunto duran. Cunto tiempo transcurre entre cada perodo menstrual. Lleve un registro de los sntomas; anote cundo comienzan, con qu frecuencia ocurren y cunto duran. Use lubricantes o humectantes vaginales para aliviar la  sequedad vaginal y mejorar el bienestar durante las relaciones sexuales. Cumpla con todas las visitas de seguimiento. Esto es importante. Esto incluye la terapia grupal y la psicoterapia. Comunquese con un mdico si: An tiene perodos menstruales despus de los 55 aos. Siente dolor durante las relaciones sexuales. No tuvo perodos menstruales durante los ltimos 12 meses y presenta sangrado vaginal. Solicite ayuda de inmediato si tiene: Depresin grave. Sangrado vaginal excesivo. Dolor al orinar. Latidos cardacos rpidos o irregulares (palpitaciones). Dolor de cabeza intenso. Dolor en el abdomen o indigestin grave. Resumen La menopausia es un perodo normal de la vida de una mujer en el que los perodos menstruales cesan por completo. Se define normalmente como la ausencia del periodo menstrual durante 12 meses sin otra causa mdica. La transicin a la menopausia (perimenopausia) con mayor frecuencia ocurre entre los 45 y los 55 aos, y puede durar varios aos. Los sntomas pueden controlarse mediante medicamentos, cambios en el estilo de vida y terapias complementarias, como acupuntura. Siga una dieta equilibrada que incluya muchos nutrientes para favorecer la salud sea y la salud cardaca, y para controlar los sntomas durante la menopausia. Esta informacin no tiene como fin reemplazar el consejo del mdico. Asegrese de hacerle al mdico cualquier pregunta que tenga. Document Revised: 09/15/2019 Document Reviewed: 09/15/2019 Elsevier Patient Education  2023 Elsevier Inc.  

## 2022-01-10 ENCOUNTER — Encounter (HOSPITAL_COMMUNITY): Payer: Self-pay | Admitting: Emergency Medicine

## 2022-01-10 ENCOUNTER — Ambulatory Visit (INDEPENDENT_AMBULATORY_CARE_PROVIDER_SITE_OTHER): Payer: Self-pay

## 2022-01-10 ENCOUNTER — Ambulatory Visit (HOSPITAL_COMMUNITY)
Admission: EM | Admit: 2022-01-10 | Discharge: 2022-01-10 | Disposition: A | Discharge status: Home / Self Care | Payer: Self-pay | Attending: Family Medicine | Admitting: Family Medicine

## 2022-01-10 DIAGNOSIS — H66002 Acute suppurative otitis media without spontaneous rupture of ear drum, left ear: Secondary | ICD-10-CM

## 2022-01-10 DIAGNOSIS — J069 Acute upper respiratory infection, unspecified: Secondary | ICD-10-CM

## 2022-01-10 DIAGNOSIS — R059 Cough, unspecified: Secondary | ICD-10-CM

## 2022-01-10 MED ORDER — AZITHROMYCIN 250 MG PO TABS: 250.0000 mg | ORAL_TABLET | Freq: Every day | ORAL | 0 refills | Status: DC

## 2022-01-10 MED ORDER — KETOROLAC TROMETHAMINE 60 MG/2ML IM SOLN
INTRAMUSCULAR | Status: AC
  Filled 2022-01-10: qty 2

## 2022-01-10 MED ORDER — KETOROLAC TROMETHAMINE 30 MG/ML IJ SOLN
60.0000 mg | Freq: Once | INTRAMUSCULAR | Status: AC
  Administered 2022-01-10: 60 mg via INTRAMUSCULAR

## 2022-01-10 MED ORDER — HYDROCODONE BIT-HOMATROP MBR 5-1.5 MG/5ML PO SOLN
5.0000 mL | Freq: Four times a day (QID) | ORAL | 0 refills | Status: DC | PRN
Start: 2022-01-10 — End: 2022-01-12

## 2022-01-10 NOTE — Discharge Instructions (Addendum)
Be aware, your cough medication may cause drowsiness. Please do not drive, operate heavy machinery or make important decisions while on this medication, it can cloud your judgement.  Meds ordered this encounter  Medications   HYDROcodone bit-homatropine (HYCODAN) 5-1.5 MG/5ML syrup    Sig: Take 5 mLs by mouth every 6 (six) hours as needed for cough.    Dispense:  90 mL    Refill:  0   azithromycin (ZITHROMAX) 250 MG tablet    Sig: Take 1 tablet (250 mg total) by mouth daily. Take first 2 tablets together, then 1 every day until finished.    Dispense:  6 tablet    Refill:  0   ketorolac (TORADOL) 30 MG/ML injection 60 mg

## 2022-01-10 NOTE — ED Triage Notes (Signed)
Used interpretor:  Pt c/o fever, cough, headache, scratchy throat since last Wed. Taking Advil and cough syrup. Pt hasn't been going to work. Pt reports that when coughs unable to get up any phlegm, when coughs a lot has back pains and headache.

## 2022-01-12 ENCOUNTER — Telehealth (HOSPITAL_COMMUNITY): Payer: Self-pay | Admitting: Emergency Medicine

## 2022-01-12 MED ORDER — HYDROCOD POLI-CHLORPHE POLI ER 10-8 MG/5ML PO SUER: 5.0000 mL | Freq: Two times a day (BID) | ORAL | 0 refills | Status: AC | PRN

## 2022-01-12 NOTE — Telephone Encounter (Signed)
Original medication Hycodan not available at the pharmacy.  Was previously sent by Dr. Tracie Harrier. Provider unavailable today, this provider sent Tussionex as substitute.

## 2022-01-13 NOTE — ED Provider Notes (Signed)
[facility]   [id number] 01/10/22 Arrival Time: 1623  ASSESSMENT & PLAN:  1. Viral URI with cough   2. Non-recurrent acute suppurative otitis media of left ear without spontaneous rupture of tympanic membrane    I have personally viewed the imaging studies ordered this visit. No acute changes on CXR.  Discussed typical duration of viral part of illness. OTC symptom care as needed. PCN allergic. Discharge Medication List as of 01/10/2022  6:18 PM     START taking these medications   Details  azithromycin (ZITHROMAX) 250 MG tablet Take 1 tablet (250 mg total) by mouth daily. Take first 2 tablets together, then 1 every day until finished., Starting Wed 01/10/2022, Normal    HYDROcodone bit-homatropine (HYCODAN) 5-1.5 MG/5ML syrup Take 5 mLs by mouth every 6 (six) hours as needed for cough., Starting Wed 01/10/2022, Normal        Reviewed expectations re: course of current medical issues. Questions answered. Outlined signs and symptoms indicating need for more acute intervention. Understanding verbalized. After Visit Summary given.   SUBJECTIVE: History from: Patient. Christina Vance is a 51 y.o. female. Reports: Used interpretor:  Pt c/o fever, cough, headache, scratchy throat since last Wed. Taking Advil and cough syrup. Pt hasn't been going to work. Pt reports that when coughs unable to get up any phlegm, when coughs a lot has back pains and headache.  Now with L ear pain; no drainage or bleeding. Normal PO intake without n/v/d.  OBJECTIVE:  Vitals:   01/10/22 1721  BP: 126/88  Pulse: 79  Resp: 17  Temp: 98.4 F (36.9 C)  TempSrc: Oral  SpO2: 97%    General appearance: alert; no distress Eyes: PERRLA; EOMI; conjunctiva normal HENT: [place]; AT; with nasal congestion; L TM with erythema/bulging Neck: supple  Lungs: speaks full sentences without difficulty; unlabored Extremities: no edema Skin: warm and dry Neurologic: normal  gait Psychological: alert and cooperative; normal mood and affect  Imaging: DG Chest 2 View  Result Date: 01/10/2022 CLINICAL DATA:  Cough, back pain EXAM: CHEST - 2 VIEW COMPARISON:  04/07/2021 FINDINGS: The heart size and mediastinal contours are within normal limits. Both lungs are clear. The visualized skeletal structures are unremarkable. IMPRESSION: No active cardiopulmonary disease. Electronically Signed   By: Ernie Avena M.D.   On: 01/10/2022 17:58    Allergies  Allergen Reactions   Penicillins Swelling    Has patient had a PCN reaction causing immediate rash, facial/tongue/throat swelling, SOB or lightheadedness with hypotension: No Has patient had a PCN reaction causing severe rash involving mucus membranes or skin necrosis: No Has patient had a PCN reaction that required hospitalization No Has patient had a PCN reaction occurring within the last 10 years: No If all of the above answers are "NO", then may proceed with Cephalosporin use.   Shrimp Flavor Hives and Itching   Pork-Derived Products Rash    Past Medical History:  Diagnosis Date   Anemia    Asthma    Gastritis    GERD (gastroesophageal reflux disease)    on meds   Seasonal allergies    Social History   Socioeconomic History   Marital status: Divorced    Spouse name: Not on file   Number of children: Not on file   Years of education: Not on file   Highest education level: Not on file  Occupational History   Not on file  Tobacco Use   Smoking status: Never   Smokeless tobacco: Never  Vaping  Use   Vaping Use: Never used  Substance and Sexual Activity   Alcohol use: Not Currently   Drug use: Never   Sexual activity: Yes  Other Topics Concern   Not on file  Social History Narrative   Not on file   Social Determinants of Health   Financial Resource Strain: Not on file  Food Insecurity: Not on file  Transportation Needs: Not on file  Physical Activity: Not on file  Stress: Not on file   Social Connections: Not on file  Intimate Partner Violence: Not on file   Family History  Problem Relation Age of Onset   Diabetes Mother    Diabetes Maternal Grandmother    Stomach cancer Maternal Uncle    Alcoholism Maternal Uncle    Colon cancer Neg Hx    Colon polyps Neg Hx    Esophageal cancer Neg Hx    Rectal cancer Neg Hx    Past Surgical History:  Procedure Laterality Date   CESAREAN SECTION     WISDOM TOOTH EXTRACTION       Mardella Layman, MD 01/13/22 [phone]

## 2022-05-15 ENCOUNTER — Ambulatory Visit (HOSPITAL_COMMUNITY)
Admission: EM | Admit: 2022-05-15 | Discharge: 2022-05-15 | Disposition: A | Discharge status: Home / Self Care | Payer: BLUE CROSS/BLUE SHIELD | Attending: Emergency Medicine | Admitting: Emergency Medicine

## 2022-05-15 ENCOUNTER — Encounter (HOSPITAL_COMMUNITY): Payer: Self-pay | Admitting: Emergency Medicine

## 2022-05-15 DIAGNOSIS — R110 Nausea: Secondary | ICD-10-CM

## 2022-05-15 DIAGNOSIS — R109 Unspecified abdominal pain: Secondary | ICD-10-CM

## 2022-05-15 LAB — CBG MONITORING, ED: Glucose-Capillary: 117 mg/dL — ABNORMAL HIGH (ref 70–99)

## 2022-05-15 MED ORDER — PANTOPRAZOLE SODIUM 40 MG PO TBEC: 40.0000 mg | DELAYED_RELEASE_TABLET | Freq: Every day | ORAL | 0 refills | Status: DC

## 2022-05-15 MED ORDER — ONDANSETRON 4 MG PO TBDP: 4.0000 mg | ORAL_TABLET | Freq: Four times a day (QID) | ORAL | 0 refills | Status: DC | PRN

## 2022-05-15 NOTE — ED Provider Notes (Signed)
[place]    CSN: [id number] Arrival date & time: 05/15/22  1101      History   Chief Complaint Chief Complaint  Patient presents with   see note     HPI Christina Vance is a 52 y.o. female.  Medical interpretor used for this encounter Unclear history. Hasn't felt well for about a month, possible decreased appetite, has some odd symptoms after eating such as "gets dizzy, hands swollen, and back of neck gets warm" Nausea mild after eating. No abdominal pain but some discomfort in the belly after eats.  No fever, vomiting, diarrhea. No urinary symptoms. Denies cough or throat irritation.   No current symptoms today.   Per patient she is pre-diabetic. Does not have a primary care provider   Past Medical History:  Diagnosis Date   Anemia    Asthma    Gastritis    GERD (gastroesophageal reflux disease)    on meds   Seasonal allergies     Patient Active Problem List   Diagnosis Date Noted   Visit for routine gyn exam 04/19/2021   Encounter for mammogram to establish baseline mammogram 04/19/2021   Vasomotor symptoms due to menopause 04/19/2021    Past Surgical History:  Procedure Laterality Date   CESAREAN SECTION     WISDOM TOOTH EXTRACTION      OB History     Gravida  5   Para  5   Term  5   Preterm  0   AB  0   Living  5      SAB      IAB      Ectopic      Multiple      Live Births               Home Medications    Prior to Admission medications   Medication Sig Start Date End Date Taking? Authorizing Provider  ondansetron (ZOFRAN-ODT) 4 MG disintegrating tablet Take 1 tablet (4 mg total) by mouth every 6 (six) hours as needed for nausea or vomiting. 05/15/22  Yes Rayshard Schirtzinger, Wells Guiles, PA-C  pantoprazole (PROTONIX) 40 MG tablet Take 1 tablet (40 mg total) by mouth daily for 14 days. 05/15/22 05/29/22 Yes Rocky Rishel, Wells Guiles, PA-C  acetaminophen (TYLENOL) 500 MG tablet Take 500 mg by mouth daily as needed for headache.     [provider]  diphenhydrAMINE (BENADRYL) 25 MG tablet Take 1 tablet (25 mg total) by mouth every 6 (six) hours as needed for itching (Rash). Patient not taking: Reported on 08/27/2017 05/15/15 04/20/19  Montine Circle, PA-C    Family History Family History  Problem Relation Age of Onset   Diabetes Mother    Diabetes Maternal Grandmother    Stomach cancer Maternal Uncle    Alcoholism Maternal Uncle    Colon cancer Neg Hx    Colon polyps Neg Hx    Esophageal cancer Neg Hx    Rectal cancer Neg Hx     Social History Social History   Tobacco Use   Smoking status: Never   Smokeless tobacco: Never  Vaping Use   Vaping Use: Never used  Substance Use Topics   Alcohol use: Not Currently   Drug use: Never     Allergies   Penicillins, Shrimp flavor, and Pork-derived products   Review of Systems Review of Systems As per HPI  Physical Exam Triage Vital Signs ED Triage Vitals  Enc Vitals Group     BP 05/15/22 1131 120/85  Pulse Rate 05/15/22 1131 81     Resp 05/15/22 1131 16     Temp 05/15/22 1131 98.2 F (36.8 C)     Temp Source 05/15/22 1131 Oral     SpO2 05/15/22 1131 94 %     Weight --      Height --      Head Circumference --      Peak Flow --      Pain Score 05/15/22 1129 6     Pain Loc --      Pain Edu? --      Excl. in [place]? --    No data found.  Updated Vital Signs BP 120/85 (BP Location: Left Arm)   Pulse 81   Temp 98.2 F (36.8 C) (Oral)   Resp 16   SpO2 94%    Physical Exam Vitals and nursing note reviewed.  Constitutional:      General: She is not in acute distress.    Appearance: She is not ill-appearing.  HENT:     Mouth/Throat:     Mouth: Mucous membranes are moist.     Pharynx: Oropharynx is clear.  Eyes:     Conjunctiva/sclera: Conjunctivae normal.  Cardiovascular:     Rate and Rhythm: Normal rate and regular rhythm.     Pulses: Normal pulses.     Heart sounds: Normal heart sounds.  Pulmonary:     Effort: Pulmonary  effort is normal.     Breath sounds: Normal breath sounds.  Abdominal:     General: There is no distension.     Palpations: Abdomen is soft. There is no mass.     Tenderness: There is no abdominal tenderness. There is no guarding or rebound.  Musculoskeletal:        General: No swelling, tenderness or signs of injury. Normal range of motion.     Cervical back: Normal range of motion. No rigidity or tenderness.  Skin:    General: Skin is warm and dry.  Neurological:     General: No focal deficit present.     Mental Status: She is alert and oriented to person, place, and time.      UC Treatments / Results  Labs (all labs ordered are listed, but only abnormal results are displayed) Labs Reviewed  CBG MONITORING, ED - Abnormal; Notable for the following components:      Result Value   Glucose-Capillary 117 (*)    All other components within normal limits    EKG   Radiology No results found.  Procedures Procedures (including critical care time)  Medications Ordered in UC Medications - No data to display  Initial Impression / Assessment and Plan / UC Course  I have reviewed the triage vital signs and the nursing notes.  Pertinent labs & imaging results that were available during my care of the patient were reviewed by me and considered in my medical decision making (see chart for details).  CBG 117 Stable vitals, normal exam. Discussed she needs to be seen by a primary care for full work up. Unclear etiology of symptoms. Will treat nausea with zofran q6 prn, trial of Protonix daily to see if discomfort after eating is related to reflux etiology. I had originally set her up with a PCP for next Monday. However after patient discharged her appointment was canceled due to insurance provider. Advised to call clinics to see who will accept insurance and get scheduled asap. ED precautions.   Final Clinical Impressions(s) / UC Diagnoses  Final diagnoses:  Nausea without  vomiting  Abdominal discomfort     Discharge Instructions      Puede tomar el medicamento para las nuseas (zofran) 30 minutos antes de comer para Engineer, agricultural. se disuelve debajo de la lengua. tome Protonix una vez al da [employer]. siga con su proveedor de atencin primaria en su visita del lunes.     ED Prescriptions     Medication Sig Dispense Auth. Provider   ondansetron (ZOFRAN-ODT) 4 MG disintegrating tablet Take 1 tablet (4 mg total) by mouth every 6 (six) hours as needed for nausea or vomiting. 20 tablet Evania Lyne, PA-C   pantoprazole (PROTONIX) 40 MG tablet Take 1 tablet (40 mg total) by mouth daily for 14 days. 14 tablet Venesha Petraitis, Wells Guiles, PA-C      PDMP not reviewed this encounter.   Les Pou, [place] 05/15/22 1345

## 2022-05-15 NOTE — ED Triage Notes (Signed)
Used interpretor  Pt reports for a while (month or so) hasn't felt good. Pt eating very little like if do smoothie or juice get headache and back of neck gets very warm. Pt gets nausea and will get up phlegm.  Pt had month that tried intermittent fasting due to every time eat or drink gets dizzy and hands get swollen.  Pt adds that after eats her back hurts as well.  Left arm pain as well.  Pt is pre-diabetic. Told had gallstone before Sometimes will take aspirin.

## 2022-05-15 NOTE — Discharge Instructions (Signed)
Puede tomar el medicamento para las nuseas (zofran) 30 minutos antes de comer para Engineer, agricultural. se disuelve debajo de la lengua. tome Protonix una vez al da [employer]. siga con su proveedor de atencin primaria en su visita del lunes.

## 2022-05-21 ENCOUNTER — Ambulatory Visit: Payer: BLUE CROSS/BLUE SHIELD | Admitting: Family Medicine

## 2022-08-04 IMAGING — DX DG CHEST 2V
2 series · 2 of 2 positions shown · non-contrast
Comparison: Radiograph 06/15/2020

CLINICAL DATA: Shortness of breath, chest pain

EXAM:
CHEST - 2 VIEW

[chest pa]
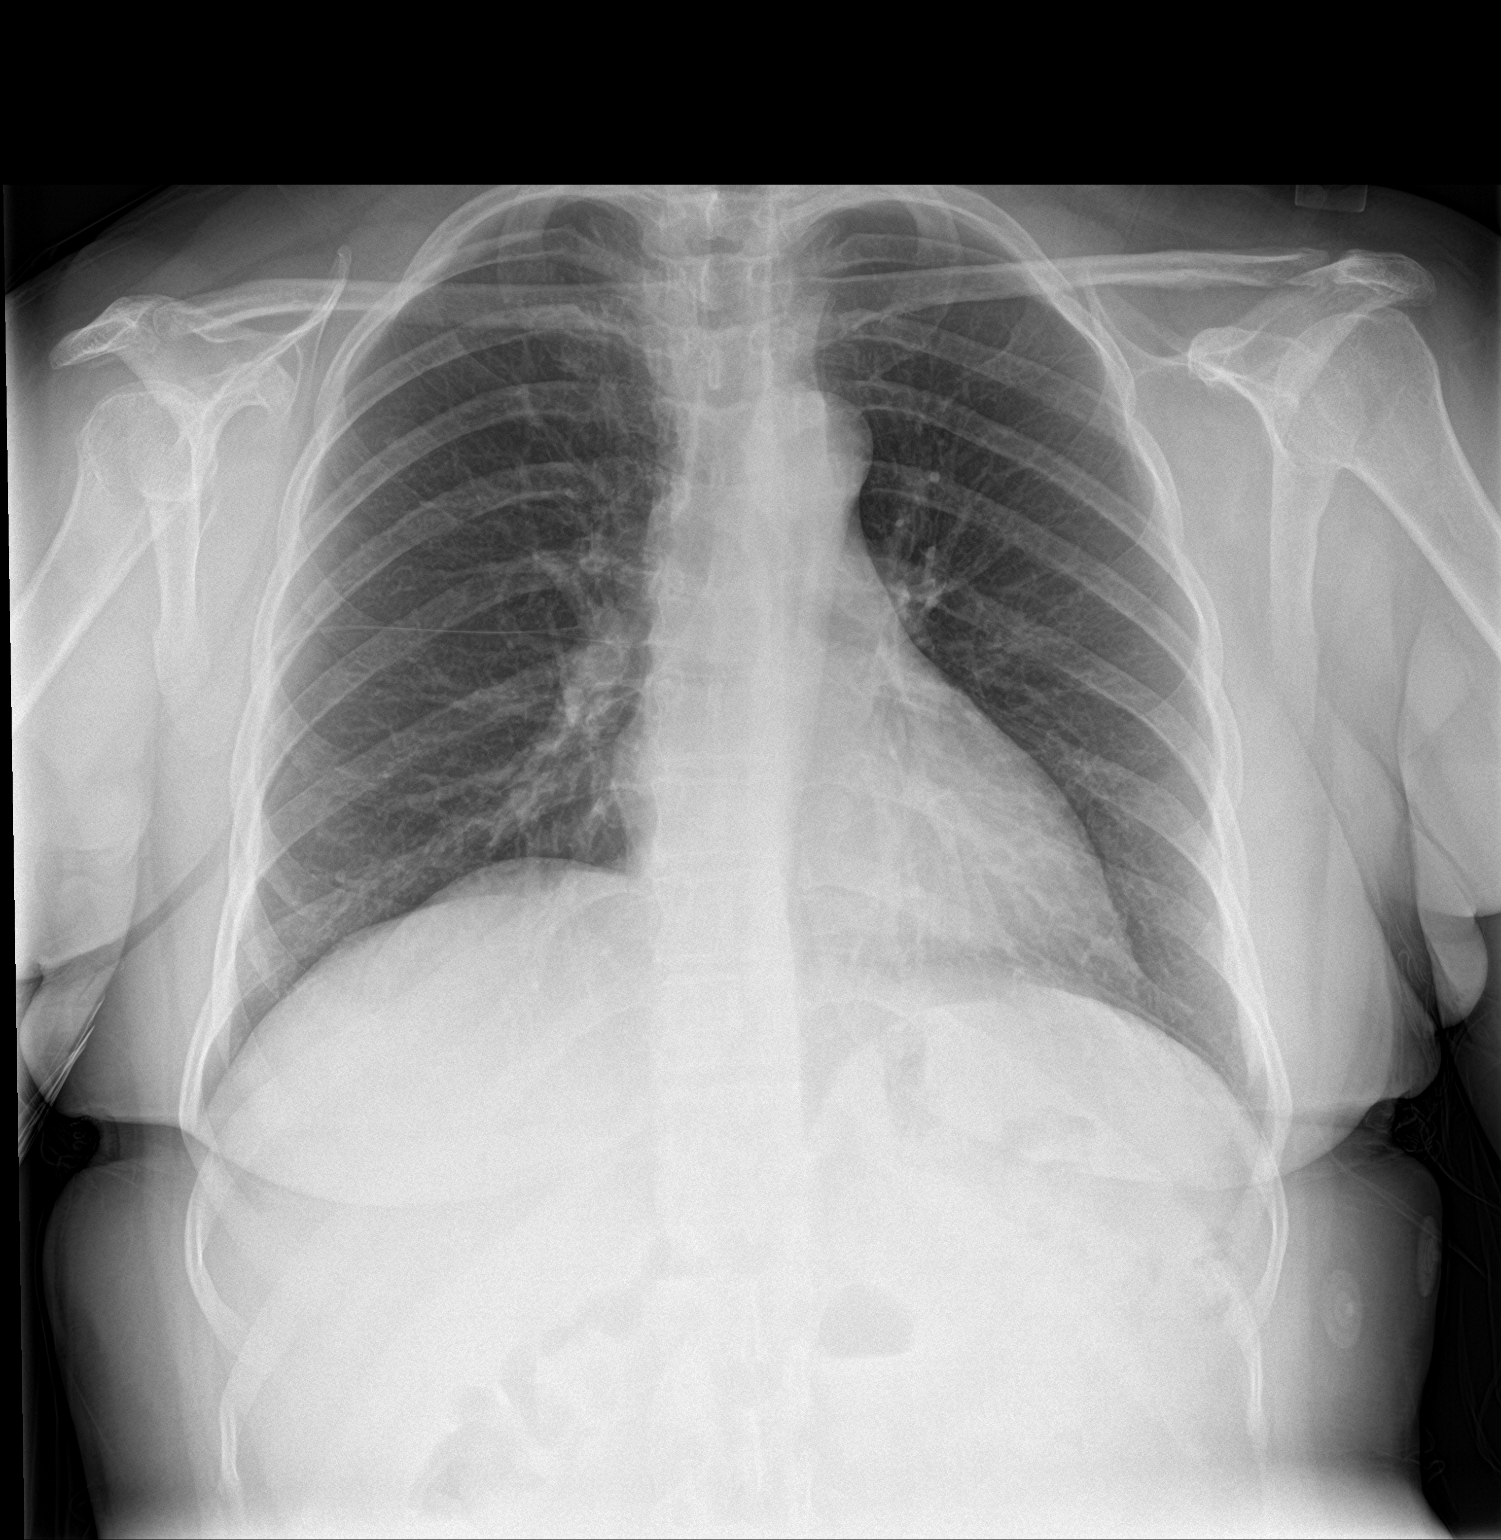

[chest lat]
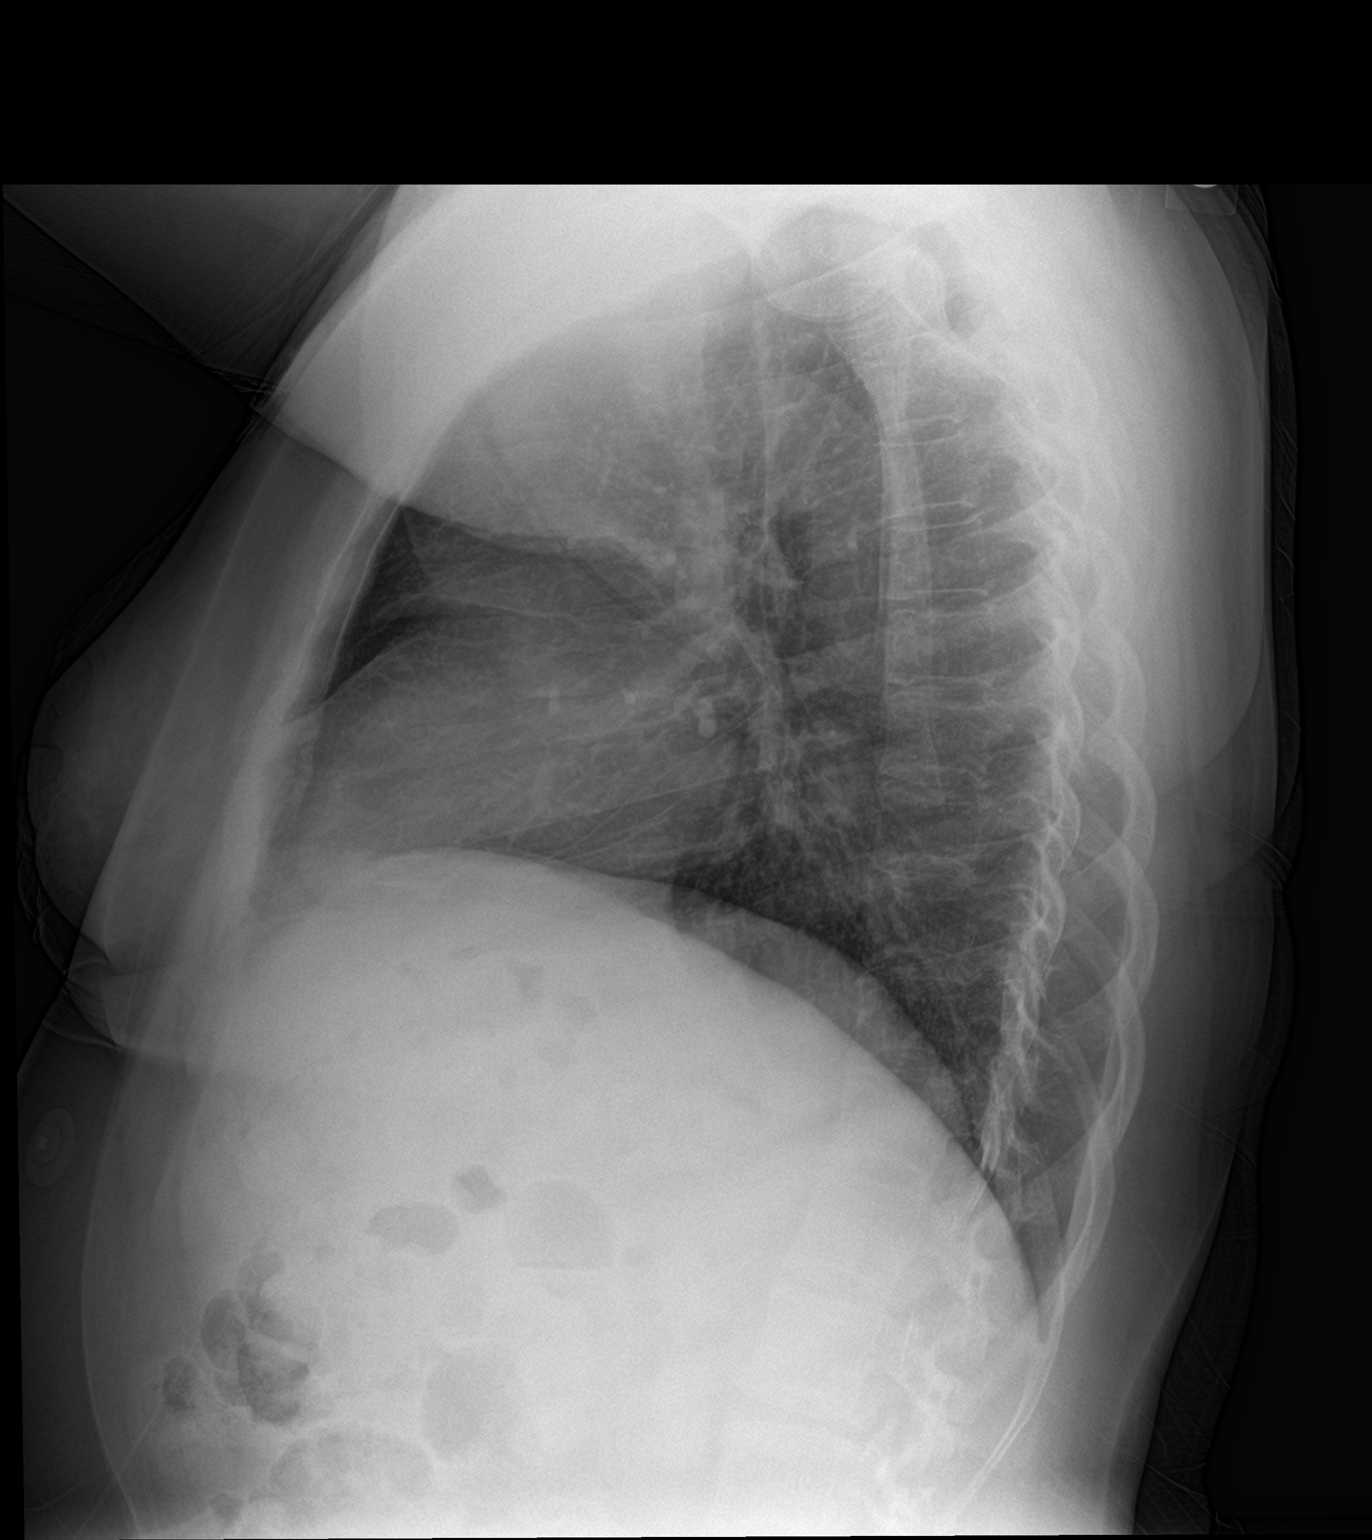

[2 of 2 positions shown; findings below may reference images not displayed]

FINDINGS: The heart size and mediastinal contours are within normal limits.
Both lungs are clear. The visualized skeletal structures are
unremarkable.
IMPRESSION: No evidence of acute cardiopulmonary disease.

## 2022-08-24 ENCOUNTER — Inpatient Hospital Stay (HOSPITAL_BASED_OUTPATIENT_CLINIC_OR_DEPARTMENT_OTHER): Admission: RE | Admit: 2022-08-24 | Payer: 59 | Source: Ambulatory Visit | Admitting: Radiology

## 2022-08-25 ENCOUNTER — Ambulatory Visit (HOSPITAL_BASED_OUTPATIENT_CLINIC_OR_DEPARTMENT_OTHER): Payer: 59 | Admitting: Radiology

## 2022-11-16 ENCOUNTER — Emergency Department (HOSPITAL_COMMUNITY)
Admission: EM | Admit: 2022-11-16 | Discharge: 2022-11-17 | Disposition: A | Discharge status: Home / Self Care | Payer: BLUE CROSS/BLUE SHIELD | Attending: Emergency Medicine | Admitting: Emergency Medicine

## 2022-11-16 DIAGNOSIS — R079 Chest pain, unspecified: Secondary | ICD-10-CM | POA: Insufficient documentation

## 2022-11-16 DIAGNOSIS — J45909 Unspecified asthma, uncomplicated: Secondary | ICD-10-CM | POA: Insufficient documentation

## 2022-11-16 DIAGNOSIS — Z7951 Long term (current) use of inhaled steroids: Secondary | ICD-10-CM | POA: Insufficient documentation

## 2022-11-17 ENCOUNTER — Emergency Department (HOSPITAL_COMMUNITY): Payer: BLUE CROSS/BLUE SHIELD

## 2022-11-17 ENCOUNTER — Other Ambulatory Visit: Payer: Self-pay

## 2022-11-17 ENCOUNTER — Encounter (HOSPITAL_COMMUNITY): Payer: Self-pay | Admitting: *Deleted

## 2022-11-17 LAB — CBC
HCT: 38.8 % (ref 36.0–46.0)
Hemoglobin: 12.3 g/dL (ref 12.0–15.0)
MCH: 27.5 pg (ref 26.0–34.0)
MCHC: 31.7 g/dL (ref 30.0–36.0)
MCV: 86.6 fL (ref 80.0–100.0)
Platelets: 232 10*3/uL (ref 150–400)
RBC: 4.48 MIL/uL (ref 3.87–5.11)
RDW: 13.9 % (ref 11.5–15.5)
WBC: 9 10*3/uL (ref 4.0–10.5)
nRBC: 0 % (ref 0.0–0.2)

## 2022-11-17 LAB — BASIC METABOLIC PANEL
Anion gap: 9 (ref 5–15)
BUN: 10 mg/dL (ref 6–20)
CO2: 25 mmol/L (ref 22–32)
Calcium: 9.4 mg/dL (ref 8.9–10.3)
Chloride: 104 mmol/L (ref 98–111)
Creatinine, Ser: 0.66 mg/dL (ref 0.44–1.00)
GFR, Estimated: >60 mL/min (ref >60)
Glucose, Bld: 106 mg/dL — ABNORMAL HIGH (ref 70–99)
Potassium: 4 mmol/L (ref 3.5–5.1)
Sodium: 138 mmol/L (ref 135–145)

## 2022-11-17 LAB — TROPONIN I (HIGH SENSITIVITY)
Troponin I (High Sensitivity): <2 ng/L (ref <18)
Troponin I (High Sensitivity): 4 ng/L (ref <18)

## 2022-11-17 LAB — HCG, SERUM, QUALITATIVE: Preg, Serum: NEGATIVE

## 2022-11-17 MED ORDER — ALBUTEROL SULFATE HFA 108 (90 BASE) MCG/ACT IN AERS: 2.0000 | INHALATION_SPRAY | RESPIRATORY_TRACT | 1 refills | Status: DC | PRN

## 2022-11-17 MED ORDER — SUCRALFATE 1 G PO TABS: 1.0000 g | ORAL_TABLET | Freq: Four times a day (QID) | ORAL | 0 refills | Status: DC | PRN

## 2022-11-17 NOTE — ED Triage Notes (Signed)
The pt is c/o rt sided chest pain for 2-3 days she has had this in the past but it went away

## 2022-11-17 NOTE — Discharge Instructions (Signed)
You were evaluated in the Emergency Department and after careful evaluation, we did not find any emergent condition requiring admission or further testing in the hospital.  Your exam/testing today is overall reassuring.  Symptoms likely related to acid reflux.  No signs of heart damage in your blood work.  Recommend close follow-up with your primary care doctor.  Continue your omeprazole.  Can use the Carafate as needed up to 4 times daily.  Please return to the Emergency Department if you experience any worsening of your condition.   Thank you for allowing [place] to be a part of your care.

## 2022-11-17 NOTE — ED Provider Notes (Signed)
[facility] Emergency Department Provider Note MRN:  [id number]  Arrival date & time: 11/17/22     Chief Complaint   Chest Pain   History of Present Illness   Christina Vance is a 52 y.o. year-old female with no pertinent past medical history presenting to the ED with chief complaint of chest pain.  Pain in the center of the chest, worse with meals, worse with laying flat.  Present for 2 or 3 days.  No other symptoms.  Review of Systems  A thorough review of systems was obtained and all systems are negative except as noted in the HPI and PMH.   Patient's Health History    Past Medical History:  Diagnosis Date   Anemia    Asthma    Gastritis    GERD (gastroesophageal reflux disease)    on meds   Seasonal allergies     Past Surgical History:  Procedure Laterality Date   CESAREAN SECTION     WISDOM TOOTH EXTRACTION      Family History  Problem Relation Age of Onset   Diabetes Mother    Diabetes Maternal Grandmother    Stomach cancer Maternal Uncle    Alcoholism Maternal Uncle    Colon cancer Neg Hx    Colon polyps Neg Hx    Esophageal cancer Neg Hx    Rectal cancer Neg Hx     Social History   Socioeconomic History   Marital status: Divorced    Spouse name: Not on file   Number of children: Not on file   Years of education: Not on file   Highest education level: Not on file  Occupational History   Not on file  Tobacco Use   Smoking status: Never   Smokeless tobacco: Never  Vaping Use   Vaping status: Never Used  Substance and Sexual Activity   Alcohol use: Not Currently   Drug use: Never   Sexual activity: Yes  Other Topics Concern   Not on file  Social History Narrative   Not on file   Social Determinants of Health   Financial Resource Strain: Not on file  Food Insecurity: Not on file  Transportation Needs: Not on file  Physical Activity: Not on file  Stress: Not on file  Social Connections: Unknown (07/10/2021)    Received from [facility], [facility]   Social Network    Social Network: Not on file  Intimate Partner Violence: Unknown (06/01/2021)   Received from [employer], [facility]   HITS    Physically Hurt: Not on file    Insult or Talk Down To: Not on file    Threaten Physical Harm: Not on file    Scream or Curse: Not on file     Physical Exam   Vitals:   11/16/22 2357 11/17/22 0332  BP: 124/80 117/76  Pulse: 83 73  Resp: 16 16  Temp: 98.4 F (36.9 C) 97.7 F (36.5 C)  SpO2: 97% 100%    CONSTITUTIONAL: Well-appearing, NAD NEURO/PSYCH:  Alert and oriented x 3, no focal deficits EYES:  eyes equal and reactive ENT/NECK:  no LAD, no JVD CARDIO: Regular rate, well-perfused, normal S1 and S2 PULM:  CTAB no wheezing or rhonchi GI/GU:  non-distended, non-tender MSK/SPINE:  No gross deformities, no edema SKIN:  no rash, atraumatic   *Additional and/or pertinent findings included in MDM below  Diagnostic and Interventional Summary    EKG Interpretation Date/Time:  Saturday November 17 2022 00:00:53 EDT Ventricular  Rate:  87 PR Interval:  136 QRS Duration:  78 QT Interval:  360 QTC Calculation: 433 R Axis:   32  Text Interpretation: Normal sinus rhythm Low voltage QRS Borderline ECG When compared with ECG of 07-Apr-2021 10:32, PREVIOUS ECG IS PRESENT Confirmed by Kennis Carina [phone]) on 11/17/2022 4:21:49 AM       Labs Reviewed  BASIC METABOLIC PANEL - Abnormal; Notable for the following components:      Result Value   Glucose, Bld 106 (*)    All other components within normal limits  CBC  HCG, SERUM, QUALITATIVE  TROPONIN I (HIGH SENSITIVITY)  TROPONIN I (HIGH SENSITIVITY)    DG Chest 2 View  Final Result      Medications - No data to display   Procedures  /  Critical Care Procedures  ED Course and Medical Decision Making  Initial Impression and Ddx Favoring GERD related pain, ACS is also considered.  Highly doubt PE.  Well-appearing 52 year old  female in no acute distress with normal vital signs.  Past medical/surgical history that increases complexity of ED encounter: History of GERD  Interpretation of Diagnostics I personally reviewed the EKG and my interpretation is as follows: Sinus rhythm without concerning ischemic features  Labs reassuring with no significant blood count or electrolyte disturbance.  Troponin negative x 2  Patient Reassessment and Ultimate Disposition/Management     Discharge  Patient management required discussion with the following services or consulting groups:  None  Complexity of Problems Addressed Acute illness or injury that poses threat of life of bodily function  Additional Data Reviewed and Analyzed Further history obtained from: None  Additional Factors Impacting ED Encounter Risk Prescriptions  Elmer Sow. Pilar Plate, MD [facility] Emergency Medicine [facility] [email or web address]  Final Clinical Impressions(s) / ED Diagnoses     ICD-10-CM   1. Chest pain, unspecified type  R07.9       ED Discharge Orders          Ordered    sucralfate (CARAFATE) 1 g tablet  4 times daily PRN        11/17/22 0458    albuterol (VENTOLIN HFA) 108 (90 Base) MCG/ACT inhaler  Every 4 hours PRN        11/17/22 0459             Discharge Instructions Discussed with and Provided to Patient:    Discharge Instructions      You were evaluated in the Emergency Department and after careful evaluation, we did not find any emergent condition requiring admission or further testing in the hospital.  Your exam/testing today is overall reassuring.  Symptoms likely related to acid reflux.  No signs of heart damage in your blood work.  Recommend close follow-up with your primary care doctor.  Continue your omeprazole.  Can use the Carafate as needed up to 4 times daily.  Please return to the Emergency Department if you experience any worsening of your condition.   Thank you for allowing  [place] to be a part of your care.      Sabas Sous, MD 11/17/22 0500

## 2023-01-10 ENCOUNTER — Encounter (HOSPITAL_COMMUNITY): Payer: Self-pay | Admitting: Emergency Medicine

## 2023-01-10 ENCOUNTER — Other Ambulatory Visit: Payer: Self-pay

## 2023-01-10 ENCOUNTER — Ambulatory Visit (HOSPITAL_COMMUNITY)
Admission: EM | Admit: 2023-01-10 | Discharge: 2023-01-10 | Disposition: A | Discharge status: Home / Self Care | Payer: BLUE CROSS/BLUE SHIELD | Attending: Family Medicine | Admitting: Family Medicine

## 2023-01-10 DIAGNOSIS — M542 Cervicalgia: Secondary | ICD-10-CM

## 2023-01-10 DIAGNOSIS — R051 Acute cough: Secondary | ICD-10-CM | POA: Diagnosis not present

## 2023-01-10 DIAGNOSIS — R519 Headache, unspecified: Secondary | ICD-10-CM | POA: Diagnosis not present

## 2023-01-10 MED ORDER — METHYLPREDNISOLONE 4 MG PO TBPK: ORAL_TABLET | ORAL | 0 refills | Status: DC

## 2023-01-10 MED ORDER — CYCLOBENZAPRINE HCL 10 MG PO TABS: 10.0000 mg | ORAL_TABLET | Freq: Two times a day (BID) | ORAL | 0 refills | Status: DC | PRN

## 2023-01-10 NOTE — Discharge Instructions (Signed)
Lo atendieron hoy por dolor de cabeza, dolor de cuello y tos. Es probable que Chief Technology Officer de cuello y el dolor de cabeza sean de origen muscular.  Le envi un relajante muscular para tratar esto.  Esto lo cansar o le dar sueo, as que llvelo cuando est en casa y no est conduciendo.  Recomiendo tambin una almohadilla trmica para el cuello y la espalda.  Le envi un esteroide para ayudar tanto con el dolor de cabeza como con la tos.   Por favor, descanse mucho.  Vaya a urgencias si no mejora o empeora.  You were seen today for headache, neck pain and cough. Your neck pain and headache are likely musclar in origin.  I have sent out a muscle relaxer to treat this.  This will make you tired/sleepy so please take when home and not driving.  I recommend a heating pad as well to the neck and back.  I have sent out a steroid to help with both the headache and cough.   Please get plenty of rest.  Please go to the ER if not improving or worsening.

## 2023-01-10 NOTE — ED Triage Notes (Signed)
Interpreter used for visit. Patient states for the last 3 days she has had pain, explained as a headache, on the left side of her head. 8/10 throbbing pain. This only happens when she is stressed.

## 2023-01-10 NOTE — ED Provider Notes (Signed)
[facility]    CSN: [id number] Arrival date & time: 01/10/23  1404      History   Chief Complaint No chief complaint on file.   HPI Calley Zurfluh is a 52 y.o. female.   Spanish interpreter used today.  She is here for left sided head, neck, shoulder pain x 3 days.  This has been constant.  She has had the neck pain/off for several weeks, but now this is constant and also causing headache.  No n/v.  No light sensativity, no blurry vision.  She has take advil and asprin with some help, but then it comes back when stressed at work.   She has had a cough for several days as well.  No wheezing or sob.  She does have asthma.   No fever/chills.        Past Medical History:  Diagnosis Date   Anemia    Asthma    Gastritis    GERD (gastroesophageal reflux disease)    on meds   Seasonal allergies     Patient Active Problem List   Diagnosis Date Noted   Visit for routine gyn exam 04/19/2021   Encounter for mammogram to establish baseline mammogram 04/19/2021   Vasomotor symptoms due to menopause 04/19/2021    Past Surgical History:  Procedure Laterality Date   CESAREAN SECTION     WISDOM TOOTH EXTRACTION      OB History     Gravida  5   Para  5   Term  5   Preterm  0   AB  0   Living  5      SAB      IAB      Ectopic      Multiple      Live Births               Home Medications    Prior to Admission medications   Medication Sig Start Date End Date Taking? Authorizing Provider  acetaminophen (TYLENOL) 500 MG tablet Take 500 mg by mouth daily as needed for headache.    [provider]  albuterol (VENTOLIN HFA) 108 (90 Base) MCG/ACT inhaler Inhale 2 puffs into the lungs every 4 (four) hours as needed for wheezing or shortness of breath. 11/17/22   Sabas Sous, MD  ondansetron (ZOFRAN-ODT) 4 MG disintegrating tablet Take 1 tablet (4 mg total) by mouth every 6 (six) hours as needed for nausea or vomiting.  05/15/22   Rising, Lurena Joiner, PA-C  pantoprazole (PROTONIX) 40 MG tablet Take 1 tablet (40 mg total) by mouth daily for 14 days. 05/15/22 05/29/22  Rising, Lurena Joiner, PA-C  sucralfate (CARAFATE) 1 g tablet Take 1 tablet (1 g total) by mouth 4 (four) times daily as needed. 11/17/22   Sabas Sous, MD  diphenhydrAMINE (BENADRYL) 25 MG tablet Take 1 tablet (25 mg total) by mouth every 6 (six) hours as needed for itching (Rash). Patient not taking: Reported on 08/27/2017 05/15/15 04/20/19  Roxy Horseman, PA-C    Family History Family History  Problem Relation Age of Onset   Diabetes Mother    Diabetes Maternal Grandmother    Stomach cancer Maternal Uncle    Alcoholism Maternal Uncle    Colon cancer Neg Hx    Colon polyps Neg Hx    Esophageal cancer Neg Hx    Rectal cancer Neg Hx     Social History Social History   Tobacco Use   Smoking status: Never  Smokeless tobacco: Never  Vaping Use   Vaping status: Never Used  Substance Use Topics   Alcohol use: Not Currently   Drug use: Never     Allergies   Penicillins, Shrimp flavor, and Pork-derived products   Review of Systems Review of Systems  Constitutional: Negative.   HENT: Negative.    Respiratory:  Positive for cough. Negative for shortness of breath and wheezing.   Cardiovascular: Negative.   Gastrointestinal: Negative.   Genitourinary: Negative.   Musculoskeletal:  Positive for myalgias and neck pain.  Skin: Negative.   Neurological:  Positive for headaches.  Psychiatric/Behavioral: Negative.       Physical Exam Triage Vital Signs ED Triage Vitals  Encounter Vitals Group     BP 01/10/23 1421 (!) 147/84     Systolic BP Percentile --      Diastolic BP Percentile --      Pulse Rate 01/10/23 1421 77     Resp 01/10/23 1421 14     Temp 01/10/23 1421 98.5 F (36.9 C)     Temp Source 01/10/23 1421 Oral     SpO2 01/10/23 1421 95 %     Weight --      Height --      Head Circumference --      Peak Flow --      Pain  Score 01/10/23 1420 8     Pain Loc --      Pain Education --      Exclude from Growth Chart --    No data found.  Updated Vital Signs BP (!) 147/84 (BP Location: Left Arm)   Pulse 77   Temp 98.5 F (36.9 C) (Oral)   Resp 14   SpO2 95%   Visual Acuity Right Eye Distance:   Left Eye Distance:   Bilateral Distance:    Right Eye Near:   Left Eye Near:    Bilateral Near:     Physical Exam Constitutional:      General: She is not in acute distress.    Appearance: Normal appearance. She is normal weight. She is not ill-appearing.  HENT:     Nose: Nose normal.     Mouth/Throat:     Mouth: Mucous membranes are moist.  Eyes:     Extraocular Movements: Extraocular movements intact.     Conjunctiva/sclera: Conjunctivae normal.     Pupils: Pupils are equal, round, and reactive to light.  Cardiovascular:     Rate and Rhythm: Normal rate and regular rhythm.  Pulmonary:     Effort: Pulmonary effort is normal.     Breath sounds: Normal breath sounds. No wheezing.  Musculoskeletal:     Cervical back: Normal range of motion and neck supple. No rigidity or tenderness.     Comments: She has TTP to the left upper back/trapezius, and left neck;  no TTP to the left temple;  full rom of the neck with some pain with movement;    Lymphadenopathy:     Cervical: No cervical adenopathy.  Neurological:     General: No focal deficit present.     Mental Status: She is alert and oriented to person, place, and time.     Cranial Nerves: No cranial nerve deficit.     Sensory: No sensory deficit.     Motor: No weakness.  Psychiatric:        Mood and Affect: Mood normal.        Behavior: Behavior normal.      UC  Treatments / Results  Labs (all labs ordered are listed, but only abnormal results are displayed) Labs Reviewed - No data to display  EKG   Radiology No results found.  Procedures Procedures (including critical care time)  Medications Ordered in UC Medications - No data to  display  Initial Impression / Assessment and Plan / UC Course  I have reviewed the triage vital signs and the nursing notes.  Pertinent labs & imaging results that were available during my care of the patient were reviewed by me and considered in my medical decision making (see chart for details).   Final Clinical Impressions(s) / UC Diagnoses   Final diagnoses:  Nonintractable headache, unspecified chronicity pattern, unspecified headache type  Neck pain  Acute cough     Discharge Instructions      Lo atendieron hoy por dolor de cabeza, dolor de cuello y tos. Es probable que Chief Technology Officer de cuello y el dolor de cabeza sean de origen muscular.  Le envi un relajante muscular para tratar esto.  Esto lo cansar o le dar sueo, as que llvelo cuando est en casa y no est conduciendo.  Recomiendo tambin una almohadilla trmica para el cuello y la espalda.  Le envi un esteroide para ayudar tanto con el dolor de cabeza como con la tos.   Por favor, descanse mucho.  Vaya a urgencias si no mejora o empeora.  You were seen today for headache, neck pain and cough. Your neck pain and headache are likely musclar in origin.  I have sent out a muscle relaxer to treat this.  This will make you tired/sleepy so please take when home and not driving.  I recommend a heating pad as well to the neck and back.  I have sent out a steroid to help with both the headache and cough.   Please get plenty of rest.  Please go to the ER if not improving or worsening.     ED Prescriptions     Medication Sig Dispense Auth. Provider   cyclobenzaprine (FLEXERIL) 10 MG tablet Take 1 tablet (10 mg total) by mouth 2 (two) times daily as needed for muscle spasms. 20 tablet Neven Fina, Denny Peon, MD   methylPREDNISolone (MEDROL DOSEPAK) 4 MG TBPK tablet Take as directed 1 each Jannifer Franklin, MD      PDMP not reviewed this encounter.   Jannifer Franklin, MD 01/10/23 1450

## 2023-01-10 NOTE — ED Provider Notes (Incomplete)
[facility]    CSN: [id number] Arrival date & time: 01/10/23  1404      History   Chief Complaint No chief complaint on file.   HPI Christina Vance is a 52 y.o. female.   Spanish interpreter used today.  She is here for headache x 3 days.  It is located to the left side of the head.  Throbbing pain, will also go into the left shoulder.  It has been constant the last three days, before that it was mostly just at the neck and shoulder.  She has taken advil and aspirin for pain with some help, but it comes back again.  She is under more stress at work and seems to worsen it.  No n/v.   She has had coughing the last several weeks.  She does have asthma.  She looses her voice.  No wheezing or sob noted.     {The patient has been seen in Urgent Care in the last 3 years. :1}    Past Medical History:  Diagnosis Date  . Anemia   . Asthma   . Gastritis   . GERD (gastroesophageal reflux disease)    on meds  . Seasonal allergies     Patient Active Problem List   Diagnosis Date Noted  . Visit for routine gyn exam 04/19/2021  . Encounter for mammogram to establish baseline mammogram 04/19/2021  . Vasomotor symptoms due to menopause 04/19/2021    Past Surgical History:  Procedure Laterality Date  . CESAREAN SECTION    . WISDOM TOOTH EXTRACTION      OB History     Gravida  5   Para  5   Term  5   Preterm  0   AB  0   Living  5      SAB      IAB      Ectopic      Multiple      Live Births               Home Medications    Prior to Admission medications   Medication Sig Start Date End Date Taking? Authorizing Provider  acetaminophen (TYLENOL) 500 MG tablet Take 500 mg by mouth daily as needed for headache.    [provider]  albuterol (VENTOLIN HFA) 108 (90 Base) MCG/ACT inhaler Inhale 2 puffs into the lungs every 4 (four) hours as needed for wheezing or shortness of breath. 11/17/22   Sabas Sous, MD   ondansetron (ZOFRAN-ODT) 4 MG disintegrating tablet Take 1 tablet (4 mg total) by mouth every 6 (six) hours as needed for nausea or vomiting. 05/15/22   Rising, Lurena Joiner, PA-C  pantoprazole (PROTONIX) 40 MG tablet Take 1 tablet (40 mg total) by mouth daily for 14 days. 05/15/22 05/29/22  Rising, Lurena Joiner, PA-C  sucralfate (CARAFATE) 1 g tablet Take 1 tablet (1 g total) by mouth 4 (four) times daily as needed. 11/17/22   Sabas Sous, MD  diphenhydrAMINE (BENADRYL) 25 MG tablet Take 1 tablet (25 mg total) by mouth every 6 (six) hours as needed for itching (Rash). Patient not taking: Reported on 08/27/2017 05/15/15 04/20/19  Roxy Horseman, PA-C    Family History Family History  Problem Relation Age of Onset  . Diabetes Mother   . Diabetes Maternal Grandmother   . Stomach cancer Maternal Uncle   . Alcoholism Maternal Uncle   . Colon cancer Neg Hx   . Colon polyps Neg Hx   .  Esophageal cancer Neg Hx   . Rectal cancer Neg Hx     Social History Social History   Tobacco Use  . Smoking status: Never  . Smokeless tobacco: Never  Vaping Use  . Vaping status: Never Used  Substance Use Topics  . Alcohol use: Not Currently  . Drug use: Never     Allergies   Penicillins, Shrimp flavor, and Pork-derived products   Review of Systems Review of Systems   Physical Exam Triage Vital Signs ED Triage Vitals  Encounter Vitals Group     BP 01/10/23 1421 (!) 147/84     Systolic BP Percentile --      Diastolic BP Percentile --      Pulse Rate 01/10/23 1421 77     Resp 01/10/23 1421 14     Temp 01/10/23 1421 98.5 F (36.9 C)     Temp Source 01/10/23 1421 Oral     SpO2 01/10/23 1421 95 %     Weight --      Height --      Head Circumference --      Peak Flow --      Pain Score 01/10/23 1420 8     Pain Loc --      Pain Education --      Exclude from Growth Chart --    No data found.  Updated Vital Signs BP (!) 147/84 (BP Location: Left Arm)   Pulse 77   Temp 98.5 F (36.9 C)  (Oral)   Resp 14   SpO2 95%   Visual Acuity Right Eye Distance:   Left Eye Distance:   Bilateral Distance:    Right Eye Near:   Left Eye Near:    Bilateral Near:     Physical Exam   UC Treatments / Results  Labs (all labs ordered are listed, but only abnormal results are displayed) Labs Reviewed - No data to display  EKG   Radiology No results found.  Procedures Procedures (including critical care time)  Medications Ordered in UC Medications - No data to display  Initial Impression / Assessment and Plan / UC Course  I have reviewed the triage vital signs and the nursing notes.  Pertinent labs & imaging results that were available during my care of the patient were reviewed by me and considered in my medical decision making (see chart for details).     *** Final Clinical Impressions(s) / UC Diagnoses   Final diagnoses:  None   Discharge Instructions   None    ED Prescriptions   None    PDMP not reviewed this encounter.

## 2023-03-21 ENCOUNTER — Encounter (HOSPITAL_COMMUNITY): Payer: Self-pay

## 2023-03-21 ENCOUNTER — Emergency Department (HOSPITAL_COMMUNITY): Payer: BLUE CROSS/BLUE SHIELD

## 2023-03-21 ENCOUNTER — Emergency Department (HOSPITAL_COMMUNITY)
Admission: EM | Admit: 2023-03-21 | Discharge: 2023-03-21 | Disposition: A | Discharge status: Home / Self Care | Payer: BLUE CROSS/BLUE SHIELD | Attending: Emergency Medicine | Admitting: Emergency Medicine

## 2023-03-21 ENCOUNTER — Other Ambulatory Visit: Payer: Self-pay

## 2023-03-21 DIAGNOSIS — R1013 Epigastric pain: Secondary | ICD-10-CM | POA: Insufficient documentation

## 2023-03-21 DIAGNOSIS — R7401 Elevation of levels of liver transaminase levels: Secondary | ICD-10-CM | POA: Diagnosis not present

## 2023-03-21 DIAGNOSIS — R1011 Right upper quadrant pain: Secondary | ICD-10-CM | POA: Diagnosis present

## 2023-03-21 DIAGNOSIS — J45909 Unspecified asthma, uncomplicated: Secondary | ICD-10-CM | POA: Insufficient documentation

## 2023-03-21 HISTORY — DX: Calculus of gallbladder without cholecystitis without obstruction: K80.20

## 2023-03-21 LAB — COMPREHENSIVE METABOLIC PANEL
ALT: 52 U/L — ABNORMAL HIGH (ref 0–44)
AST: 46 U/L — ABNORMAL HIGH (ref 15–41)
Albumin: 4.1 g/dL (ref 3.5–5.0)
Alkaline Phosphatase: 132 U/L — ABNORMAL HIGH (ref 38–126)
Anion gap: 11 (ref 5–15)
BUN: 15 mg/dL (ref 6–20)
CO2: 25 mmol/L (ref 22–32)
Calcium: 9.2 mg/dL (ref 8.9–10.3)
Chloride: 97 mmol/L — ABNORMAL LOW (ref 98–111)
Creatinine, Ser: 0.75 mg/dL (ref 0.44–1.00)
GFR, Estimated: >60 mL/min (ref >60)
Glucose, Bld: 126 mg/dL — ABNORMAL HIGH (ref 70–99)
Potassium: 3.8 mmol/L (ref 3.5–5.1)
Sodium: 133 mmol/L — ABNORMAL LOW (ref 135–145)
Total Bilirubin: 0.6 mg/dL (ref 0.0–1.2)
Total Protein: 8 g/dL (ref 6.5–8.1)

## 2023-03-21 LAB — LIPASE, BLOOD: Lipase: 41 U/L (ref 11–51)

## 2023-03-21 LAB — CBC
HCT: 44.3 % (ref 36.0–46.0)
Hemoglobin: 14.4 g/dL (ref 12.0–15.0)
MCH: 28 pg (ref 26.0–34.0)
MCHC: 32.5 g/dL (ref 30.0–36.0)
MCV: 86 fL (ref 80.0–100.0)
Platelets: 262 10*3/uL (ref 150–400)
RBC: 5.15 MIL/uL — ABNORMAL HIGH (ref 3.87–5.11)
RDW: 13.6 % (ref 11.5–15.5)
WBC: 15 10*3/uL — ABNORMAL HIGH (ref 4.0–10.5)
nRBC: 0 % (ref 0.0–0.2)

## 2023-03-21 MED ORDER — IOHEXOL 350 MG/ML SOLN
75.0000 mL | Freq: Once | INTRAVENOUS | Status: AC | PRN
  Administered 2023-03-21: 75 mL via INTRAVENOUS

## 2023-03-21 MED ORDER — ONDANSETRON HCL 4 MG/2ML IJ SOLN
4.0000 mg | Freq: Once | INTRAMUSCULAR | Status: AC
  Administered 2023-03-21: 4 mg via INTRAVENOUS
  Filled 2023-03-21: qty 2

## 2023-03-21 MED ORDER — ONDANSETRON HCL 4 MG PO TABS: 4.0000 mg | ORAL_TABLET | Freq: Four times a day (QID) | ORAL | 0 refills | Status: DC

## 2023-03-21 MED ORDER — PANTOPRAZOLE SODIUM 40 MG IV SOLR
40.0000 mg | Freq: Once | INTRAVENOUS | Status: AC
  Administered 2023-03-21: 40 mg via INTRAVENOUS
  Filled 2023-03-21: qty 10

## 2023-03-21 MED ORDER — SODIUM CHLORIDE 0.9 % IV BOLUS
1000.0000 mL | Freq: Once | INTRAVENOUS | Status: AC
  Administered 2023-03-21: 1000 mL via INTRAVENOUS

## 2023-03-21 NOTE — ED Provider Notes (Signed)
[place] EMERGENCY DEPARTMENT AT [facility] Provider Note   CSN: [id number] Arrival date & time: 03/21/23  9528     History  Chief Complaint  Patient presents with   Abdominal Pain    Christina Vance is a 53 y.o. female history of gallstones, GERD, gastritis, anemia, asthma presented with right upper quadrant pain that has gotten worse over the past 24 hours.  Patient states that she has had bilious emesis and cannot keep anything down.  Patient tried her Protonix at home and this did not help.  Patient states that at [facility] she had an ultrasound and does show gallstones.  Patient feels bloated with all the vomiting.  Patient denies vaginal bleeding or GYN concerns, fevers, chest pain, shortness of breath.  Spanish interpreter [phone]  Home Medications Prior to Admission medications   Medication Sig Start Date End Date Taking? Authorizing Provider  acetaminophen (TYLENOL) 500 MG tablet Take 500 mg by mouth daily as needed for headache.    [provider]  albuterol (VENTOLIN HFA) 108 (90 Base) MCG/ACT inhaler Inhale 2 puffs into the lungs every 4 (four) hours as needed for wheezing or shortness of breath. 11/17/22   Sabas Sous, MD  cyclobenzaprine (FLEXERIL) 10 MG tablet Take 1 tablet (10 mg total) by mouth 2 (two) times daily as needed for muscle spasms. 01/10/23   Piontek, Denny Peon, MD  methylPREDNISolone (MEDROL DOSEPAK) 4 MG TBPK tablet Take as directed 01/10/23   Piontek, Denny Peon, MD  ondansetron (ZOFRAN-ODT) 4 MG disintegrating tablet Take 1 tablet (4 mg total) by mouth every 6 (six) hours as needed for nausea or vomiting. 05/15/22   Rising, Lurena Joiner, PA-C  pantoprazole (PROTONIX) 40 MG tablet Take 1 tablet (40 mg total) by mouth daily for 14 days. 05/15/22 05/29/22  Rising, Lurena Joiner, PA-C  sucralfate (CARAFATE) 1 g tablet Take 1 tablet (1 g total) by mouth 4 (four) times daily as needed. 11/17/22   Sabas Sous, MD  diphenhydrAMINE (BENADRYL) 25 MG  tablet Take 1 tablet (25 mg total) by mouth every 6 (six) hours as needed for itching (Rash). Patient not taking: Reported on 08/27/2017 05/15/15 04/20/19  Roxy Horseman, PA-C      Allergies    Penicillins, Shrimp flavor agent (non-screening), and Pork-derived products    Review of Systems   Review of Systems  Gastrointestinal:  Positive for abdominal pain.    Physical Exam Updated Vital Signs BP (!) 155/98 (BP Location: Right Arm)   Pulse 91   Temp 98.9 F (37.2 C)   Resp 16   Ht 5\' 5"  (1.651 m)   Wt 89.8 kg   SpO2 95%   BMI 32.95 kg/m  Physical Exam Vitals reviewed.  Constitutional:      General: She is not in acute distress. HENT:     Head: Normocephalic and atraumatic.  Eyes:     Extraocular Movements: Extraocular movements intact.     Conjunctiva/sclera: Conjunctivae normal.     Pupils: Pupils are equal, round, and reactive to light.  Cardiovascular:     Rate and Rhythm: Normal rate and regular rhythm.     Pulses: Normal pulses.     Heart sounds: Normal heart sounds.     Comments: 2+ bilateral radial/dorsalis pedis pulses with regular rate Pulmonary:     Effort: Pulmonary effort is normal. No respiratory distress.     Breath sounds: Normal breath sounds.  Abdominal:     Palpations: Abdomen is soft.     Tenderness: There  is abdominal tenderness in the right upper quadrant and epigastric area. There is no guarding or rebound. Negative signs include Murphy's sign, Rovsing's sign, McBurney's sign and psoas sign.  Musculoskeletal:        General: Normal range of motion.     Cervical back: Normal range of motion and neck supple.     Comments: 5 out of 5 bilateral grip/leg extension strength  Skin:    General: Skin is warm and dry.     Capillary Refill: Capillary refill takes less than 2 seconds.  Neurological:     General: No focal deficit present.     Mental Status: She is alert and oriented to person, place, and time.     Comments: Sensation intact in all 4  limbs  Psychiatric:        Mood and Affect: Mood normal.    ED Results / Procedures / Treatments   Labs (all labs ordered are listed, but only abnormal results are displayed) Labs Reviewed  COMPREHENSIVE METABOLIC PANEL - Abnormal; Notable for the following components:      Result Value   Sodium 133 (*)    Chloride 97 (*)    Glucose, Bld 126 (*)    AST 46 (*)    ALT 52 (*)    Alkaline Phosphatase 132 (*)    All other components within normal limits  CBC - Abnormal; Notable for the following components:   WBC 15.0 (*)    RBC 5.15 (*)    All other components within normal limits  LIPASE, BLOOD  URINALYSIS, ROUTINE W REFLEX MICROSCOPIC    EKG None  Radiology No results found.  Procedures Procedures    Medications Ordered in ED Medications  pantoprazole (PROTONIX) injection 40 mg (has no administration in time range)  ondansetron (ZOFRAN) injection 4 mg (has no administration in time range)  sodium chloride 0.9 % bolus 1,000 mL (has no administration in time range)    ED Course/ Medical Decision Making/ A&P                                 Medical Decision Making Amount and/or Complexity of Data Reviewed Labs: ordered. Radiology: ordered.  Risk Prescription drug management.   Christina Vance 53 y.o. presented today for abdominal pain.  Working DDx that I considered at this time includes, but not limited to, gastroenteritis, colitis, small bowel obstruction, appendicitis, cholecystitis, hepatobiliary pathology, gastritis, PUD, ACS, aortic dissection, diverticulosis/diverticulitis, pancreatitis, nephrolithiasis, medication induced, AAA, UTI, pyelonephritis, ruptured ectopic pregnancy, PID, ovarian torsion.  R/o DDx: gastroenteritis, colitis, small bowel obstruction, appendicitis, cholecystitis, ACS, aortic dissection, diverticulosis/diverticulitis, pancreatitis, nephrolithiasis, medication induced, AAA, UTI, pyelonephritis, ruptured ectopic pregnancy, PID,  ovarian torsion: These are considered less likely due to history of present illness, physical exam, labs/imaging findings.  Review of prior external notes: 06/21/2021 office visit  Unique Tests and My Interpretation:  CBC with differential: Leukocytosis 15 CMP: Mild transaminitis 46/52, mild elevation in alk phos 132 Lipase: Unremarkable UA: Did not provide sample RUQ Ultrasound: No acute findings CT ab pelvis with contrast: Small or contracted gallbladder, recommend abdominal MRI in the outpatient setting  Social Determinants of Health: none  Discussion with Independent Historian:  Family member  Discussion of Management of Tests: None  Risk: Medium: prescription drug management  Risk Stratification Score: none  Plan: On exam patient was in no acute distress with stable vitals.  On exam patient does have verbal quadrant/epigastric  tenderness and patient did show me a picture of her bilious emesis.  Do suspect this is hepatobiliary in nature and will get right upper quadrant ultrasound she does have white count.  Patient is not endorsing systemic infectious symptoms and so the white count could be reactive however will get the ultrasound to rule out cholecystitis.  Will give patient medications and fluids.  Patient's labs and imaging were ultimately reassuring.  Patient did improve with the medication given here and so we will p.o. challenge as there did not appear to be any acute pathology at this time.  Radiology does recommend abdominal MRI in the outpatient setting to further assess patient's abdominal pain and this was verbalized to the patient.  Encourage patient to continue using her Protonix as prescribed and will prescribe Zofran for her nausea.  Will p.o. challenge before discharge.  Patient is able to tolerate Gatorade and is stable to be discharged.  Patient not have any urinary concerns or lower abdominal pain does not feel she needs to give a urine sample which is reasonable  as her pain is in the upper abdomen.  Will discharge her primary care follow-up and outpatient MRI.  Will prescribe Zofran.  Encourage patient to continue using her Protonix.  Patient was given return precautions. Patient stable for discharge at this time.  Patient verbalized understanding of plan.  This chart was dictated using voice recognition software.  Despite best efforts to proofread,  errors can occur which can change the documentation meaning.         Final Clinical Impression(s) / ED Diagnoses Final diagnoses:  None    Rx / DC Orders ED Discharge Orders     None         Remi Deter 03/21/23 1019    Anders Simmonds T, DO 03/22/23 2246

## 2023-03-21 NOTE — ED Triage Notes (Signed)
Pt arrived POV for bloating, pt reports got home from work last night around 8pm and bean soup with rice with a soda went to bed and woke up feeling bloated. Pt also reports n/v with mid-abd pain, pt thinks it may be here gallbladder. Reports had [place] in past at PCP [place] and noted to have gallstones. Hx of GERD. NAD noted, A&O x4, VSS.  Spanish interpreter used for communication

## 2023-03-21 NOTE — Discharge Instructions (Signed)
Haga un seguimiento con su proveedor de atencin primaria (el que le adjunto hoy) con respecto a los sntomas recientes y la visita a la sala de Sports administrator.  Hoy sus anlisis de laboratorio y sus imgenes fueron finalmente tranquilizadores; sin embargo, Photographer un seguimiento con un proveedor de atencin primaria para que le realicen una resonancia magntica ambulatoria de su abdomen.  Le recet Zofran para aliviar sus nuseas, pero contine tomando Protonix segn lo recetado.  Si los sntomas [place] o [place], regrese a la sala de Sports administrator.  Esta traduccin se realiz con el traductor de [employer] y se hicieron todos los intentos para Licensed conveyancer.

## 2023-03-21 NOTE — ED Notes (Signed)
Patient transported to CT 

## 2024-03-21 ENCOUNTER — Ambulatory Visit
Admission: EM | Admit: 2024-03-21 | Discharge: 2024-03-21 | Disposition: A | Discharge status: Home / Self Care | Payer: Self-pay | Attending: Nurse Practitioner | Admitting: Nurse Practitioner

## 2024-03-21 DIAGNOSIS — M7989 Other specified soft tissue disorders: Secondary | ICD-10-CM

## 2024-03-21 DIAGNOSIS — R3589 Other polyuria: Secondary | ICD-10-CM

## 2024-03-21 DIAGNOSIS — J22 Unspecified acute lower respiratory infection: Secondary | ICD-10-CM

## 2024-03-21 DIAGNOSIS — R531 Weakness: Secondary | ICD-10-CM

## 2024-03-21 DIAGNOSIS — R200 Anesthesia of skin: Secondary | ICD-10-CM

## 2024-03-21 LAB — POCT URINE DIPSTICK
Bilirubin, UA: NEGATIVE
Glucose, UA: NEGATIVE mg/dL
Ketones, POC UA: NEGATIVE mg/dL
Leukocytes, UA: NEGATIVE
Nitrite, UA: NEGATIVE
POC PROTEIN,UA: NEGATIVE
Spec Grav, UA: 1.025
Urobilinogen, UA: 0.2 U/dL
pH, UA: 5

## 2024-03-21 LAB — GLUCOSE, POCT (MANUAL RESULT ENTRY): POC Glucose: 80 mg/dL (ref 70–99)

## 2024-03-21 MED ORDER — AZITHROMYCIN 250 MG PO TABS: 250.0000 mg | ORAL_TABLET | Freq: Every day | ORAL | 0 refills | Status: AC

## 2024-03-21 MED ORDER — BENZONATATE 200 MG PO CAPS: 200.0000 mg | ORAL_CAPSULE | Freq: Three times a day (TID) | ORAL | 0 refills | Status: AC | PRN

## 2024-03-21 MED ORDER — IPRATROPIUM-ALBUTEROL 0.5-2.5 (3) MG/3ML IN SOLN
3.0000 mL | Freq: Once | RESPIRATORY_TRACT | Status: AC
  Administered 2024-03-21: 3 mL via RESPIRATORY_TRACT

## 2024-03-21 MED ORDER — ALBUTEROL SULFATE HFA 108 (90 BASE) MCG/ACT IN AERS: 2.0000 | INHALATION_SPRAY | RESPIRATORY_TRACT | 0 refills | Status: AC | PRN

## 2024-03-21 MED ORDER — PREDNISONE 20 MG PO TABS: 40.0000 mg | ORAL_TABLET | Freq: Every day | ORAL | 0 refills | Status: AC

## 2024-03-21 MED ORDER — DEXAMETHASONE SOD PHOSPHATE PF 10 MG/ML IJ SOLN
10.0000 mg | Freq: Once | INTRAMUSCULAR | Status: AC
  Administered 2024-03-21: 10 mg via INTRAMUSCULAR

## 2024-03-21 NOTE — Discharge Instructions (Addendum)
 Usted fue atendida hoy por falta de aire que ha estado presente por aproximadamente una Vayas, junto con mareos y debilidad. Su evaluacin fue tranquilizadora, y no se encontraron signos de un problema cardaco grave. Sus sntomas son consistentes con una infeccin respiratoria baja que est causando irritacin de las vas areas y broncoespasmo. En la clnica se le administr un tratamiento respiratorio y una inyeccin de esteroide, con mejora de la respiracin. Se le recet azitromicina, un curso corto de esteroides orales y advertising account executive de albuterol  para usar segn sea necesario. Tome los united parcel segn lo indicado, descanse, mantngase bien hidratada y evite el humo u otros irritantes. Sentarse erguida y usar el inhalador cuando tenga falta de aire puede ayudar a [employer] sntomas.  Usted tambin report sntomas crnicos como miccin frecuente, aumento de la sed, entumecimiento u hormigueo en brazos y piernas, visin borrosa y hinchazn en las piernas. Su examen de orina y nivel de azcar en sangre hoy fueron normales, y se enviaron anlisis adicionales. Se le notificar si algn resultado es anormal y podr Theme Park Manager. Es importante que haga seguimiento con un proveedor de atencin primaria para una evaluacin ms completa. Se le proporcion informacin de [employer] para atencin mdica accesible en espaol.  Busque atencin de emergencia de inmediato si presenta empeoramiento de la falta de aire, dolor en el pecho, desmayos, hinchazn nueva o que [employer] piernas, fiebre alta, confusin, labios o dedos azulados, o cualquier empeoramiento repentino de sus sntomas.

## 2024-03-21 NOTE — ED Notes (Signed)
Unable to draw blood

## 2024-03-21 NOTE — ED Provider Notes (Signed)
 " [facility]    CSN: [id number] Arrival date & time: 03/21/24  9041      History   Chief Complaint Chief Complaint  Patient presents with   Shortness of Breath    HPI Christina Vance is a 54 y.o. female.   Spanish Interpreter  [place] [phone]  Discussed the use of AI scribe software for clinical note transcription with the patient, who gave verbal consent to proceed.   Daphane is a female patient presenting with shortness of breath that has been ongoing for approximately one week. She describes mild wheezing and a sensation of being unable to take a deep breath, stating that she feels she has not been able to breathe normally during this time. She also reports associated dizziness occurring with the shortness of breath and generalized weakness. Additional symptoms include bilateral ear pain, rhinorrhea, sore throat, cough, and headaches. She has a history of asthma and uses an inhaler intermittently. For her current symptoms, she has taken acetaminophen , aspirin, and Theraflu. She denies fever, nausea, vomiting, diarrhea, chest tightness with deep breathing, speech difficulties, or constipation.  The patient additionally reports chronic swelling of the feet, legs, and ankles for approximately one year. She describes a generalized sense of dizziness and notes an episode of whole-body shakiness a few days ago. She reports blurred vision for several years, which she feels may be slightly worse during her current illness. She also endorses numbness and tingling in both arms extending to the hands, as well as in the legs and feet, occurring almost daily for the past one to one and a half years. She reports nocturia with frequent nighttime urination and has noticed white patches on her skin and legs. She also reports a larger white patch in the perianal/genital area first noted approximately eight months ago, which is nonpainful. She states she read online that this may be  related to menopause. The patient reports intermittently elevated blood pressure readings in the past but has never been prescribed medication. She does not have a primary care provider and does not receive routine medical care. She denies tobacco and vaping use.   The following sections of the patient's history were reviewed and updated as appropriate: allergies, current medications, past family history, past medical history, past social history, past surgical history, and problem list.        Past Medical History:  Diagnosis Date   Anemia    Asthma    Gallstones    Gastritis    GERD (gastroesophageal reflux disease)    on meds   Seasonal allergies     Patient Active Problem List   Diagnosis Date Noted   Visit for routine gyn exam 04/19/2021   Encounter for mammogram to establish baseline mammogram 04/19/2021   Vasomotor symptoms due to menopause 04/19/2021    Past Surgical History:  Procedure Laterality Date   CESAREAN SECTION     WISDOM TOOTH EXTRACTION      OB History     Gravida  5   Para  5   Term  5   Preterm  0   AB  0   Living  5      SAB      IAB      Ectopic      Multiple      Live Births               Home Medications    Prior to Admission medications  Medication Sig Start Date  End Date Taking? Authorizing Provider  albuterol  (VENTOLIN  HFA) 108 (90 Base) MCG/ACT inhaler Inhale 2 puffs into the lungs every 4 (four) hours as needed for wheezing or shortness of breath (bronchospasms). 03/21/24  Yes Iola Lukes, FNP  azithromycin  (ZITHROMAX ) 250 MG tablet Take 1 tablet (250 mg total) by mouth daily. Take first 2 tablets together, then 1 every day until finished. 03/21/24  Yes Gola Bribiesca, FNP  benzonatate  (TESSALON ) 200 MG capsule Take 1 capsule (200 mg total) by mouth 3 (three) times daily as needed for cough. 03/21/24  Yes Avigail Pilling, FNP  predniSONE  (DELTASONE ) 20 MG tablet Take 2 tablets (40 mg total) by mouth daily  for 5 days. 03/21/24 03/26/24 Yes Desira Alessandrini, FNP  diphenhydrAMINE  (BENADRYL ) 25 MG tablet Take 1 tablet (25 mg total) by mouth every 6 (six) hours as needed for itching (Rash). Patient not taking: Reported on 08/27/2017 05/15/15 04/20/19  Vicky Charleston, PA-C    Family History Family History  Problem Relation Age of Onset   Diabetes Mother    Diabetes Maternal Grandmother    Stomach cancer Maternal Uncle    Alcoholism Maternal Uncle    Colon cancer Neg Hx    Colon polyps Neg Hx    Esophageal cancer Neg Hx    Rectal cancer Neg Hx     Social History Social History[1]   Allergies   Penicillins, Shrimp flavor agent (non-screening), and Porcine (pork) protein-containing drug products   Review of Systems Review of Systems  Constitutional:  Negative for fever.  HENT:  Positive for ear pain (bilateral), rhinorrhea and sore throat. Negative for congestion and sneezing.   Eyes:  Positive for visual disturbance (blurred for the past year or so, a little worse since being sick).  Respiratory:  Positive for cough, chest tightness, shortness of breath and wheezing (a little).        Feels like she can't take in a deep breath at times   Cardiovascular:  Positive for leg swelling (bilaterally, as well as hands for the past year). Negative for chest pain and palpitations.  Gastrointestinal:  Negative for constipation, diarrhea, nausea, rectal pain and vomiting.       Painless spot next to anus and buttock that has been present for the past 8 months    Endocrine: Positive for polydipsia and polyuria.  Musculoskeletal:  Negative for myalgias.  Skin:  Positive for color change (white spots on arms and legs that's been present for a while).  Neurological:  Positive for dizziness (occurs with dyspnea), weakness (generalized), numbness (bilaterally to arms, hands and legs daily for the past year and a half) and headaches.  All other systems reviewed and are negative.    Physical  Exam Triage Vital Signs ED Triage Vitals  Encounter Vitals Group     BP 03/21/24 1124 127/84     Girls Systolic BP Percentile --      Girls Diastolic BP Percentile --      Boys Systolic BP Percentile --      Boys Diastolic BP Percentile --      Pulse Rate 03/21/24 1124 69     Resp 03/21/24 1124 17     Temp --      Temp src --      SpO2 03/21/24 1124 95 %     Weight --      Height --      Head Circumference --      Peak Flow --      Pain Score  03/21/24 1123 5     Pain Loc --      Pain Education --      Exclude from Growth Chart --    No data found.  Updated Vital Signs BP 127/84   Pulse 69   Resp 17   SpO2 95%   Visual Acuity Right Eye Distance:   Left Eye Distance:   Bilateral Distance:    Right Eye Near:   Left Eye Near:    Bilateral Near:     Physical Exam Vitals reviewed.  Constitutional:      General: She is awake. She is not in acute distress.    Appearance: Normal appearance. She is well-developed, well-groomed and overweight. She is not ill-appearing, toxic-appearing or diaphoretic.  HENT:     Head: Normocephalic.     Right Ear: Hearing, tympanic membrane, ear canal and external ear normal. No drainage, swelling or tenderness. No middle ear effusion. Tympanic membrane is not erythematous.     Left Ear: Hearing, tympanic membrane, ear canal and external ear normal. No drainage, swelling or tenderness.  No middle ear effusion. Tympanic membrane is not erythematous.     Nose: Congestion present.     Right Sinus: No maxillary sinus tenderness or frontal sinus tenderness.     Left Sinus: No maxillary sinus tenderness or frontal sinus tenderness.     Mouth/Throat:     Lips: Pink.     Mouth: Mucous membranes are moist.     Pharynx: Oropharynx is clear. Uvula midline. No pharyngeal swelling, oropharyngeal exudate, posterior oropharyngeal erythema or uvula swelling.     Tonsils: No tonsillar exudate or tonsillar abscesses.  Eyes:     General: Vision grossly  intact.     Extraocular Movements: Extraocular movements intact.     Right eye: Normal extraocular motion and no nystagmus.     Left eye: Normal extraocular motion and no nystagmus.     Conjunctiva/sclera: Conjunctivae normal.     Pupils: Pupils are equal, round, and reactive to light.  Cardiovascular:     Rate and Rhythm: Normal rate and regular rhythm.     Pulses: Normal pulses.     Heart sounds: Normal heart sounds.     Comments: Bilateral non-pitting lower extremity swelling noted  Pulmonary:     Effort: Pulmonary effort is normal. No tachypnea or respiratory distress.     Breath sounds: Normal breath sounds and air entry. No decreased air movement. No decreased breath sounds, wheezing or rhonchi.     Comments: Respirations even and unlabored  Musculoskeletal:        General: Normal range of motion.     Cervical back: Full passive range of motion without pain, normal range of motion and neck supple.     Right lower leg: Edema present.     Left lower leg: Edema present.  Lymphadenopathy:     Cervical: No cervical adenopathy.  Skin:    General: Skin is warm and dry.  Neurological:     General: No focal deficit present.     Mental Status: She is alert and oriented to person, place, and time.  Psychiatric:        Speech: Speech normal.        Behavior: Behavior is cooperative.      UC Treatments / Results  Labs (all labs ordered are listed, but only abnormal results are displayed) Labs Reviewed  POCT URINE DIPSTICK - Abnormal; Notable for the following components:      Result Value  Blood, UA small (*)    All other components within normal limits  CBC WITH DIFFERENTIAL/PLATELET  COMPREHENSIVE METABOLIC PANEL WITH GFR  HEMOGLOBIN A1C  TSH  GLUCOSE, POCT (MANUAL RESULT ENTRY)    EKG   Radiology No results found.  Procedures Procedures (including critical care time)  Medications Ordered in UC Medications  dexamethasone  (DECADRON ) injection 10 mg (has no  administration in time range)  ipratropium-albuterol  (DUONEB) 0.5-2.5 (3) MG/3ML nebulizer solution 3 mL (3 mLs Nebulization Given 03/21/24 1218)    Initial Impression / Assessment and Plan / UC Course  I have reviewed the triage vital signs and the nursing notes.  Pertinent labs & imaging results that were available during my care of the patient were reviewed by me and considered in my medical decision making (see chart for details).     Patient presents with acute shortness of breath and dyspnea over the past week, associated with dizziness and generalized weakness. Given the history of chronic bilateral lower extremity swelling for approximately one year, consideration was given to cardiac etiologies including heart failure, pulmonary edema, or arrhythmia. On evaluation, the patient was afebrile, normotensive, and nontoxic appearing with even, unlabored respirations and normal pulmonary effort. Cardiac examination revealed regular rate and rhythm with normal heart sounds and pulses. Bilateral nonpitting lower extremity edema was noted without focal neurologic deficits. Based on clinical presentation and exam, findings are most consistent with an acute lower respiratory infection with bronchospasm rather than an acute cardiac process.  The patient was treated with a nebulized breathing treatment in clinic for chest tightness and sensation of difficulty taking a deep breath, with improvement noted. Steroid injection given in clinic. She was prescribed azithromycin , a short course of oral steroids, and an albuterol  MDI to use as needed.   Patient also reports multiple chronic multisystem complaints including polyuria, polydipsia, numbness and tingling of the extremities for over one year, blurred vision, and generalized weakness, raising concern for possible metabolic or endocrine conditions such as diabetes. Urinalysis showed no glucose or ketones, and point-of-care glucose was 80.   CBC, CMP, TSH  and A1C were obtained; patient will be notified of abnormal results and may review normal results via MyChart. Given the chronic nature of these symptoms and lack of an established primary care provider, further evaluation was deferred to outpatient primary care. As the patient is uninsured, she was provided information for [facility], a Spanish-speaking clinic offering affordable care, and advised to schedule follow-up for ongoing evaluation and management.  Patient was advised to seek emergency care for worsening shortness of breath, chest pain, new or worsening leg swelling, syncope, or other concerning symptoms.  Today's evaluation has revealed no signs of a dangerous process. Discussed diagnosis with patient and/or guardian. Patient and/or guardian aware of their diagnosis, possible red flag symptoms to watch out for and need for close follow up. Patient and/or guardian understands verbal and written discharge instructions. Patient and/or guardian comfortable with plan and disposition.  Patient and/or guardian has a clear mental status at this time, good insight into illness (after discussion and teaching) and has clear judgment to make decisions regarding their care  Documentation was completed with the aid of voice recognition software. Transcription may contain typographical errors.   Final Clinical Impressions(s) / UC Diagnoses   Final diagnoses:  Polyuria  Lower respiratory tract infection  Generalized weakness  Extremity numbness  Swelling of extremity     Discharge Instructions      Usted fue atendida hoy por  falta de aire que ha estado presente por aproximadamente una [place], junto con mareos y debilidad. Su evaluacin fue tranquilizadora, y no se encontraron signos de un problema cardaco grave. Sus sntomas son consistentes con una infeccin respiratoria baja que est causando irritacin de las vas areas y broncoespasmo. En la clnica se le administr un  tratamiento respiratorio y una inyeccin de esteroide, con mejora de la respiracin. Se le recet azitromicina, un curso corto de esteroides orales y advertising account executive de albuterol  para usar segn sea necesario. Tome los united parcel segn lo indicado, descanse, mantngase bien hidratada y evite el humo u otros irritantes. Sentarse erguida y usar el inhalador cuando tenga falta de aire puede ayudar a [employer] sntomas.  Usted tambin report sntomas crnicos como miccin frecuente, aumento de la sed, entumecimiento u hormigueo en brazos y piernas, visin borrosa y hinchazn en las piernas. Su examen de orina y nivel de azcar en sangre hoy fueron normales, y se enviaron anlisis adicionales. Se le notificar si algn resultado es anormal y podr [employer]. Es importante que haga seguimiento con un proveedor de atencin primaria para una evaluacin ms completa. Se le proporcion informacin de [facility] para atencin mdica accesible en espaol.  Busque atencin de emergencia de inmediato si presenta empeoramiento de la falta de aire, dolor en el pecho, desmayos, hinchazn nueva o que [employer] piernas, fiebre alta, confusin, labios o dedos azulados, o cualquier empeoramiento repentino de sus sntomas.      ED Prescriptions     Medication Sig Dispense Auth. Provider   azithromycin  (ZITHROMAX ) 250 MG tablet Take 1 tablet (250 mg total) by mouth daily. Take first 2 tablets together, then 1 every day until finished. 6 tablet Iola Lukes, FNP   predniSONE  (DELTASONE ) 20 MG tablet Take 2 tablets (40 mg total) by mouth daily for 5 days. 10 tablet Iola Lukes, FNP   benzonatate  (TESSALON ) 200 MG capsule Take 1 capsule (200 mg total) by mouth 3 (three) times daily as needed for cough. 30 capsule Iola Lukes, FNP   albuterol  (VENTOLIN  HFA) 108 (90 Base) MCG/ACT inhaler Inhale 2 puffs into the lungs every 4 (four) hours as needed for  wheezing or shortness of breath (bronchospasms). 18 g Iola Lukes, FNP      PDMP not reviewed this encounter.     [1]  Social History Tobacco Use   Smoking status: Never   Smokeless tobacco: Never  Vaping Use   Vaping status: Never Used  Substance Use Topics   Alcohol use: Not Currently   Drug use: Never     Iola Lukes, FNP 03/21/24 1231  "

## 2024-03-21 NOTE — ED Triage Notes (Signed)
 Pt present with c/o SOB, chest tightness x 1 week. Pt states she has taken Advil for headaches and aspirin. She has also taken Theraflu. States she has lung pain. Pt states she has asthma and is concerned she has had a flare up. Pt does not have an inhaler at home to use.

## 2024-03-24 ENCOUNTER — Ambulatory Visit (HOSPITAL_COMMUNITY): Payer: Self-pay

## 2024-03-24 LAB — CBC WITH DIFFERENTIAL/PLATELET
Basophils Absolute: 0 10*3/uL (ref 0.0–0.2)
Basos: 1 %
EOS (ABSOLUTE): 0.1 10*3/uL (ref 0.0–0.4)
Eos: 2 %
Hematocrit: 44.2 % (ref 34.0–46.6)
Hemoglobin: 13.7 g/dL (ref 11.1–15.9)
Immature Grans (Abs): 0 10*3/uL (ref 0.0–0.1)
Immature Granulocytes: 0 %
Lymphocytes Absolute: 3.4 10*3/uL — ABNORMAL HIGH (ref 0.7–3.1)
Lymphs: 42 %
MCH: 28.4 pg (ref 26.6–33.0)
MCHC: 31 g/dL — ABNORMAL LOW (ref 31.5–35.7)
MCV: 92 fL (ref 79–97)
Monocytes Absolute: 0.4 10*3/uL (ref 0.1–0.9)
Monocytes: 5 %
Neutrophils Absolute: 4 10*3/uL (ref 1.4–7.0)
Neutrophils: 50 %
Platelets: 241 10*3/uL (ref 150–450)
RBC: 4.83 x10E6/uL (ref 3.77–5.28)
RDW: 12.9 % (ref 11.7–15.4)
WBC: 8.1 10*3/uL (ref 3.4–10.8)

## 2024-03-24 LAB — COMPREHENSIVE METABOLIC PANEL WITH GFR
ALT: 50 [IU]/L — ABNORMAL HIGH (ref 0–32)
AST: 50 [IU]/L — ABNORMAL HIGH (ref 0–40)
Albumin: 4.6 g/dL (ref 3.8–4.9)
Alkaline Phosphatase: 166 [IU]/L — ABNORMAL HIGH (ref 49–135)
BUN/Creatinine Ratio: 18 (ref 9–23)
BUN: 12 mg/dL (ref 6–24)
Bilirubin Total: 0.3 mg/dL (ref 0.0–1.2)
CO2: 23 mmol/L (ref 20–29)
Calcium: 10 mg/dL (ref 8.7–10.2)
Chloride: 100 mmol/L (ref 96–106)
Creatinine, Ser: 0.68 mg/dL (ref 0.57–1.00)
Globulin, Total: 3.1 g/dL (ref 1.5–4.5)
Glucose: 78 mg/dL (ref 70–99)
Potassium: 3.9 mmol/L (ref 3.5–5.2)
Sodium: 139 mmol/L (ref 134–144)
Total Protein: 7.7 g/dL (ref 6.0–8.5)
eGFR: 105 mL/min/{1.73_m2}

## 2024-03-24 LAB — HEMOGLOBIN A1C
Est. average glucose Bld gHb Est-mCnc: 131 mg/dL
Hgb A1c MFr Bld: 6.2 % — ABNORMAL HIGH (ref 4.8–5.6)

## 2024-03-24 LAB — TSH: TSH: 1.31 u[IU]/mL (ref 0.450–4.500)

## 2024-03-30 ENCOUNTER — Telehealth: Payer: Self-pay

## 2024-03-30 NOTE — Telephone Encounter (Signed)
 Contacted pt to confirm appt for 2-36-26  Appt has been confirmed  Pacific Interpreter: [place] ID: [phone]

## 2024-03-31 ENCOUNTER — Emergency Department (HOSPITAL_COMMUNITY): Payer: Self-pay

## 2024-03-31 ENCOUNTER — Ambulatory Visit: Payer: Self-pay | Admitting: Primary Care

## 2024-03-31 ENCOUNTER — Ambulatory Visit: Payer: Self-pay

## 2024-03-31 ENCOUNTER — Emergency Department (HOSPITAL_COMMUNITY)
Admission: EM | Admit: 2024-03-31 | Discharge: 2024-03-31 | Disposition: A | Discharge status: Home / Self Care | Payer: Self-pay | Attending: Emergency Medicine | Admitting: Emergency Medicine

## 2024-03-31 ENCOUNTER — Other Ambulatory Visit: Payer: Self-pay

## 2024-03-31 DIAGNOSIS — R0789 Other chest pain: Secondary | ICD-10-CM

## 2024-03-31 DIAGNOSIS — I890 Lymphedema, not elsewhere classified: Secondary | ICD-10-CM | POA: Insufficient documentation

## 2024-03-31 LAB — BASIC METABOLIC PANEL WITH GFR
Anion gap: 11 (ref 5–15)
BUN: 14 mg/dL (ref 6–20)
CO2: 28 mmol/L (ref 22–32)
Calcium: 9.3 mg/dL (ref 8.9–10.3)
Chloride: 100 mmol/L (ref 98–111)
Creatinine, Ser: 0.8 mg/dL (ref 0.44–1.00)
GFR, Estimated: >60 mL/min
Glucose, Bld: 129 mg/dL — ABNORMAL HIGH (ref 70–99)
Potassium: 4 mmol/L (ref 3.5–5.1)
Sodium: 139 mmol/L (ref 135–145)

## 2024-03-31 LAB — TROPONIN T, HIGH SENSITIVITY
Troponin T High Sensitivity: <6 ng/L (ref 0–19)
Troponin T High Sensitivity: <6 ng/L (ref 0–19)

## 2024-03-31 LAB — CBC
HCT: 40.2 % (ref 36.0–46.0)
Hemoglobin: 13.3 g/dL (ref 12.0–15.0)
MCH: 28.7 pg (ref 26.0–34.0)
MCHC: 33.1 g/dL (ref 30.0–36.0)
MCV: 86.6 fL (ref 80.0–100.0)
Platelets: 228 10*3/uL (ref 150–400)
RBC: 4.64 MIL/uL (ref 3.87–5.11)
RDW: 13.6 % (ref 11.5–15.5)
WBC: 8.5 10*3/uL (ref 4.0–10.5)
nRBC: 0 % (ref 0.0–0.2)

## 2024-03-31 LAB — PRO BRAIN NATRIURETIC PEPTIDE: Pro Brain Natriuretic Peptide: <50 pg/mL

## 2024-03-31 NOTE — Telephone Encounter (Signed)
 FYI Only or Action Required?: FYI only for provider: ED advised.  Patient was last seen in primary care on new patient.  Called Nurse Triage reporting Arm Swelling, Circulatory Problem, and Foot Swelling.  Symptoms began 1.5 years ago.  Interventions attempted: Nothing.  Symptoms are: unchanged.  Triage Disposition: Go to ED now  Patient/caregiver understands and will follow disposition?:   Message from Victoria B sent at 03/31/2024 11:05 AM EST  Reason for Triage: patient has swelling in left foot and hand, is worse on the left side and pulsating near left breast and left arm   Reason for Disposition  [1] Red area or streak [2] large (> 2 inches or 5 cm)    Discoloration occurs when putting on sock - patient feels pressure and foot  turns black accompanied by leg swelling; not present cureently  Patient sounds very sick or weak to the triager    Patient presently has arm swelling and moderate pain accompanied with purple discoloration. Patient has pressure and feeling a pulsation between left breast and armpit area.  Additional Information  Negative: Entire foot is cool or blue in comparison to other side    Discoloration occurs when putting on sock - patient feels pressure and foot  turns black accompanied by leg swelling  Answer Assessment - Initial Assessment Questions Call via Spanish translator, [place], #[id number]. Patient with swelling in left arm and leg/foot for past 1.5 years. During call patient voiced that she was struggling to breath. Patient with hx of seasonal asthma. Used rescue inhaler during call which helped.  Going to ED at [facility], calling daughter to go in next 30 minutes   1. ONSET: When did the swelling start? (e.g., minutes, hours, days)     1.5 year ago  2. LOCATION: What part of the arm is swollen?  Are both arms swollen or just one arm?     Left arm worse than right  3. SEVERITY: How bad is the swelling? (e.g., localized; mild, moderate,  severe)     Mild-moderate, present when moving; noticeable, one centimeter bigger than right arm  4. REDNESS: Is there redness or signs of infection?     No  5. PAIN: Is the swelling painful to touch? If Yes, ask: How painful is it?   (Scale 1-10; mild, moderate or severe)     4-5/10  6. FEVER: Do you have a fever? If Yes, ask: What is it, how was it measured, and when did it start?      no  7. CAUSE: What do you think is causing the arm swelling?     Retaining fluid  8. MEDICAL HISTORY: Do you have a history of heart failure, kidney disease, liver failure, or cancer?     Hx of asthma   9. RECURRENT SYMPTOM: Have you had arm swelling before? If Yes, ask: When was the last time? What happened that time?     Continuing for the past 1.5 years  10. OTHER SYMPTOMS: Do you have any other symptoms? (e.g., chest pain, difficulty breathing)       Headache, leg swelling, pressure and foot discoloration, struggling to breathe, pulsating feeling between left breast and armpit, periodic arm discoloration (purple) and pain.  Answer Assessment - Initial Assessment Questions Call interpreted by Spanish Interpreter, [place] [phone]   1. ONSET: When did the swelling start? (e.g., minutes, hours, days)     1.5 year ago  2. LOCATION: What part of the leg is swollen?  Are both  legs swollen or just one leg?     Left leg and foot  3. SEVERITY: How bad is the swelling? (e.g., localized; mild, moderate, severe)     Feels pressure during swelling; swelling occurs when moving  4. REDNESS: Is there redness or signs of infection?     No but discoloration occurs when putting on sock foot  turns black   6. FEVER: Do you have a fever? If Yes, ask: What is it, how was it measured, and when did it start?      No  7. CAUSE: What do you think is causing the leg swelling?      I think it is fluid retention  9. RECURRENT SYMPTOM: Have you had leg swelling before?  If Yes, ask: When was the last time? What happened that time?     Yes, happens when moving  10. OTHER SYMPTOMS: Do you have any other symptoms? (e.g., chest pain, difficulty breathing)       Arm swelling, pain, pressure and discoloration  Protocols used: Arm Swelling and Edema-A-AH, Leg Swelling and Edema-A-AH

## 2024-03-31 NOTE — ED Triage Notes (Signed)
 Recently dx with bronchitis and treated with breathing treatments and has had some relief. Patient adds that sob is worse with exertion and when she is working around the house, which she states also causes her hands and feet to be swollen. States it is worse on the R side but is all four extremities. Also states that her armpits get swollen and it's uncomfortable to wear.

## 2024-03-31 NOTE — ED Notes (Signed)
 Reviewed D/C information with the patient, pt verbalized understanding. No additional concerns at this time.
